# Patient Record
Sex: Male | Born: 1950 | Race: Black or African American | Hispanic: No | Marital: Single | Smoking: Current every day smoker
Health system: Southern US, Community
[De-identification: ages and names within clinical notes are randomized; demographics above are authoritative.]

## PROBLEM LIST (undated history)

## (undated) DIAGNOSIS — I10 Essential (primary) hypertension: Secondary | ICD-10-CM

## (undated) DIAGNOSIS — S0292XB Unspecified fracture of facial bones, initial encounter for open fracture: Secondary | ICD-10-CM

## (undated) DIAGNOSIS — E785 Hyperlipidemia, unspecified: Secondary | ICD-10-CM

## (undated) DIAGNOSIS — E119 Type 2 diabetes mellitus without complications: Secondary | ICD-10-CM

## (undated) DIAGNOSIS — F17200 Nicotine dependence, unspecified, uncomplicated: Secondary | ICD-10-CM

## (undated) HISTORY — DX: Nicotine dependence, unspecified, uncomplicated: F17.200

## (undated) HISTORY — DX: Essential (primary) hypertension: I10

## (undated) HISTORY — DX: Type 2 diabetes mellitus without complications: E11.9

## (undated) HISTORY — DX: Hyperlipidemia, unspecified: E78.5

---
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
## 2004-04-04 ENCOUNTER — Emergency Department (HOSPITAL_COMMUNITY): Admission: EM | Admit: 2004-04-04 | Discharge: 2004-04-04 | Payer: Self-pay | Admitting: Emergency Medicine

## 2007-09-23 ENCOUNTER — Emergency Department (HOSPITAL_COMMUNITY): Admission: EM | Admit: 2007-09-23 | Discharge: 2007-09-23 | Payer: Self-pay | Admitting: Emergency Medicine

## 2010-10-07 ENCOUNTER — Encounter: Payer: Self-pay | Admitting: Internal Medicine

## 2011-07-05 ENCOUNTER — Inpatient Hospital Stay (INDEPENDENT_AMBULATORY_CARE_PROVIDER_SITE_OTHER)
Admission: RE | Admit: 2011-07-05 | Discharge: 2011-07-05 | Disposition: A | Discharge status: Home / Self Care | Payer: BC Managed Care – PPO | Source: Ambulatory Visit | Attending: Emergency Medicine | Admitting: Emergency Medicine

## 2011-07-05 DIAGNOSIS — I10 Essential (primary) hypertension: Secondary | ICD-10-CM

## 2011-07-05 DIAGNOSIS — H00019 Hordeolum externum unspecified eye, unspecified eyelid: Secondary | ICD-10-CM

## 2011-07-05 DIAGNOSIS — Z9119 Patient's noncompliance with other medical treatment and regimen: Secondary | ICD-10-CM

## 2011-07-05 LAB — POCT URINALYSIS DIP (DEVICE)
Glucose, UA: NEGATIVE mg/dL
Hgb urine dipstick: NEGATIVE
Leukocytes, UA: NEGATIVE
Nitrite: NEGATIVE
Specific Gravity, Urine: 1.025 (ref 1.005–1.030)
Urobilinogen, UA: 0.2 mg/dL (ref 0.0–1.0)

## 2011-07-05 LAB — POCT I-STAT, CHEM 8
BUN: 12 mg/dL (ref 6–23)
Creatinine, Ser: 1 mg/dL (ref 0.50–1.35)
Glucose, Bld: 95 mg/dL (ref 70–99)
Potassium: 3.9 mEq/L (ref 3.5–5.1)

## 2014-05-16 DIAGNOSIS — I10 Essential (primary) hypertension: Secondary | ICD-10-CM | POA: Diagnosis not present

## 2014-05-16 DIAGNOSIS — E785 Hyperlipidemia, unspecified: Secondary | ICD-10-CM | POA: Diagnosis not present

## 2014-05-16 DIAGNOSIS — F17200 Nicotine dependence, unspecified, uncomplicated: Secondary | ICD-10-CM | POA: Diagnosis not present

## 2015-10-20 ENCOUNTER — Encounter: Payer: Self-pay | Admitting: Internal Medicine

## 2015-10-20 ENCOUNTER — Ambulatory Visit (INDEPENDENT_AMBULATORY_CARE_PROVIDER_SITE_OTHER): Payer: Medicare Other | Admitting: Internal Medicine

## 2015-10-20 VITALS — BP 160/100 | HR 80 | Temp 98.0°F | Ht 68.0 in | Wt 198.5 lb

## 2015-10-20 DIAGNOSIS — I10 Essential (primary) hypertension: Secondary | ICD-10-CM | POA: Diagnosis not present

## 2015-10-20 DIAGNOSIS — E785 Hyperlipidemia, unspecified: Secondary | ICD-10-CM | POA: Diagnosis not present

## 2015-10-20 DIAGNOSIS — Z23 Encounter for immunization: Secondary | ICD-10-CM | POA: Diagnosis not present

## 2015-10-20 DIAGNOSIS — Z0000 Encounter for general adult medical examination without abnormal findings: Secondary | ICD-10-CM | POA: Insufficient documentation

## 2015-10-20 LAB — GLUCOSE, CAPILLARY: Glucose-Capillary: 128 mg/dL — ABNORMAL HIGH (ref 65–99)

## 2015-10-20 MED ORDER — LISINOPRIL 20 MG PO TABS: 20.0000 mg | ORAL_TABLET | Freq: Every day | ORAL | Status: DC

## 2015-10-20 MED ORDER — HYDROCHLOROTHIAZIDE 12.5 MG PO CAPS: 12.5000 mg | ORAL_CAPSULE | Freq: Every day | ORAL | Status: DC

## 2015-10-20 NOTE — Progress Notes (Deleted)
Patient ID: Samuel Weiss, male   DOB: 1951-02-06, 65 y.o.   MRN: [id number]   Subjective:   Patient ID: Samuel Weiss male   DOB: July 19, 1951 65 y.o.   MRN: [id number]  HPI: [name] is a 65 y.o.     No past medical history on file. No current outpatient prescriptions on file.   No current facility-administered medications for this visit.   No family history on file. Social History   Social History  . Marital Status: Single    Spouse Name: N/A  . Number of Children: N/A  . Years of Education: N/A   Social History Main Topics  . Smoking status: Not on file  . Smokeless tobacco: Not on file  . Alcohol Use: Not on file  . Drug Use: Not on file  . Sexual Activity: Not on file   Other Topics Concern  . Not on file   Social History Narrative  . No narrative on file   Review of Systems: {Review Of Systems:30496} Objective:  Physical Exam: There were no vitals filed for this visit. {Exam, Complete:17964} Assessment & Plan:

## 2015-10-20 NOTE — Assessment & Plan Note (Signed)
-  Given Flu shot today.  -Discussed need for colonoscopy, will need to arrange at future visit given issue with insurance card.  -Check basic labs; CBC, CMP, lipid panel.

## 2015-10-20 NOTE — Patient Instructions (Signed)
1. Please make a follow up appointment for 2 weeks.   2. Please take all medications as previously prescribed with the following changes:  Start taking Lisinopril 20 mg daily + HCTZ 12.5 mg daily for blood pressure control.   At your next visit, we will likely start a cholesterol medication as well.   3. If you have worsening of your symptoms or new symptoms arise, please call the clinic [phone] or go to the ER immediately if symptoms are severe.  Hypertension Hypertension, commonly called high blood pressure, is when the force of blood pumping through your arteries is too strong. Your arteries are the blood vessels that carry blood from your heart throughout your body. A blood pressure reading consists of a higher number over a lower number, such as 110/72. The higher number (systolic) is the pressure inside your arteries when your heart pumps. The lower number (diastolic) is the pressure inside your arteries when your heart relaxes. Ideally you want your blood pressure below 120/80. Hypertension forces your heart to work harder to pump blood. Your arteries may become narrow or stiff. Having untreated or uncontrolled hypertension can cause heart attack, stroke, kidney disease, and other problems. RISK FACTORS Some risk factors for high blood pressure are controllable. Others are not.  Risk factors you cannot control include:   Race. You may be at higher risk if you are African American.  Age. Risk increases with age.  Gender. Men are at higher risk than women before age 54 years. After age 33, women are at higher risk than men. Risk factors you can control include:  Not getting enough exercise or physical activity.  Being overweight.  Getting too much fat, sugar, calories, or salt in your diet.  Drinking too much alcohol. SIGNS AND SYMPTOMS Hypertension does not usually cause signs or symptoms. Extremely high blood pressure (hypertensive crisis) may cause headache, anxiety,  shortness of breath, and nosebleed. DIAGNOSIS To check if you have hypertension, your health care provider will measure your blood pressure while you are seated, with your arm held at the level of your heart. It should be measured at least twice using the same arm. Certain conditions can cause a difference in blood pressure between your right and left arms. A blood pressure reading that is higher than normal on one occasion does not mean that you need treatment. If it is not clear whether you have high blood pressure, you may be asked to return on a different day to have your blood pressure checked again. Or, you may be asked to monitor your blood pressure at home for 1 or more weeks. TREATMENT Treating high blood pressure includes making lifestyle changes and possibly taking medicine. Living a healthy lifestyle can help lower high blood pressure. You may need to change some of your habits. Lifestyle changes may include:  Following the DASH diet. This diet is high in fruits, vegetables, and whole grains. It is low in salt, red meat, and added sugars.  Keep your sodium intake below 2,300 mg per day.  Getting at least 30-45 minutes of aerobic exercise at least 4 times per week.  Losing weight if necessary.  Not smoking.  Limiting alcoholic beverages.  Learning ways to reduce stress. Your health care provider may prescribe medicine if lifestyle changes are not enough to get your blood pressure under control, and if one of the following is true:  You are 48-69 years of age and your systolic blood pressure is above 140.  You are 60  years of age or older, and your systolic blood pressure is above 150.  Your diastolic blood pressure is above 90.  You have diabetes, and your systolic blood pressure is over [id number] or your diastolic blood pressure is over 90.  You have kidney disease and your blood pressure is above 140/90.  You have heart disease and your blood pressure is above 140/90. Your  personal target blood pressure may vary depending on your medical conditions, your age, and other factors. HOME CARE INSTRUCTIONS  Have your blood pressure rechecked as directed by your health care provider.   Take medicines only as directed by your health care provider. Follow the directions carefully. Blood pressure medicines must be taken as prescribed. The medicine does not work as well when you skip doses. Skipping doses also puts you at risk for problems.  Do not smoke.   Monitor your blood pressure at home as directed by your health care provider. SEEK MEDICAL CARE IF:   You think you are having a reaction to medicines taken.  You have recurrent headaches or feel dizzy.  You have swelling in your ankles.  You have trouble with your vision. SEEK IMMEDIATE MEDICAL CARE IF:  You develop a severe headache or confusion.  You have unusual weakness, numbness, or feel faint.  You have severe chest or abdominal pain.  You vomit repeatedly.  You have trouble breathing. MAKE SURE YOU:   Understand these instructions.  Will watch your condition.  Will get help right away if you are not doing well or get worse.   This information is not intended to replace advice given to you by your health care provider. Make sure you discuss any questions you have with your health care provider.   Document Released: 09/02/2005 Document Revised: 01/17/2015 Document Reviewed: 06/25/2013 Elsevier Interactive Patient Education [employer].

## 2015-10-20 NOTE — Progress Notes (Signed)
   Subjective:   Patient ID: Samuel Weiss male   DOB: [birth year] 65 y.o.   MRN: [id number]  HPI: [name] is a 65 y.o. male w/ PMHx of HTN and ?HLD, not on any medications, presents to the clinic today for a new patient visit. Patient used to be seen at [facility], but is changing providers because his family comes to the [facility] clinic. Patient has no significant complaints today but is interested in re-establishing with a physician. He states he has diagnosed HTN but has not taken any medications since September. He does not remember what medications he took but knows he took three different pills, two for his BP and one he thinks for his cholesterol. He denies any symptoms of SOB, chest pain, dizziness, lightheadedness, nausea, diarrhea, abdominal pain, recent changes in his weight, palpitations, or difficulty sleeping. He admit to drinking 6 beers/week, smokes 1 pack of cigarettes/week, smokes marijuana occasionally. 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   Past Medical History  Diagnosis Date  . Hypertension   . Hyperlipidemia    Family History  Problem Relation Age of Onset  . Hypertension Mother   . Hypertension Father    . Current Outpatient Prescriptions  Medication Sig Dispense Refill  . hydrochlorothiazide (MICROZIDE) 12.5 MG capsule Take 1 capsule (12.5 mg total) by mouth daily. 30 capsule 1  . lisinopril (PRINIVIL,ZESTRIL) 20 MG tablet Take 1 tablet (20 mg total) by mouth daily. 30 tablet 1   No current facility-administered medications for this visit.    Review of Systems: General: Denies fever, chills, diaphoresis, appetite change and fatigue.  Respiratory: Denies SOB, DOE, cough, and wheezing.   Cardiovascular: Denies chest pain and palpitations.  Gastrointestinal: Denies nausea, vomiting, abdominal pain, and diarrhea.  Genitourinary: Denies dysuria, increased frequency, and flank pain. Endocrine: Denies hot or  cold intolerance, polyuria, and polydipsia. Musculoskeletal: Denies myalgias, back pain, joint swelling, arthralgias and gait problem.  Skin: Denies pallor, rash and wounds.  Neurological: Denies dizziness, seizures, syncope, weakness, lightheadedness, numbness and headaches.  Psychiatric/Behavioral: Denies mood changes, and sleep disturbances.  Objective:   Physical Exam: Filed Vitals:   10/20/15 1452 10/20/15 1533  BP: 197/96 160/100  Pulse: 89 80  Temp: 98 F (36.7 C)   Height: 5\' 8"  (1.727 m)   Weight: 198 lb 8 oz (90.039 kg)   SpO2: 98%     General: AA male, alert, cooperative, NAD. HEENT: PERRL, EOMI. Moist mucus membranes. Arcus senilis.  Neck: Full range of motion without pain, supple, no lymphadenopathy or carotid bruits Lungs: Clear to ascultation bilaterally, normal work of respiration, no wheezes, rales, rhonchi Heart: RRR, no murmurs, gallops, or rubs Abdomen: Soft, non-tender, non-distended, BS + Extremities: No cyanosis, clubbing, or edema Neurologic: Alert & oriented x3, cranial nerves II-XII intact, strength grossly intact, sensation intact to light touch   Assessment & Plan:   Please see problem based assessment and plan.

## 2015-10-20 NOTE — Assessment & Plan Note (Signed)
Check lipids, will likely need to be on a statin given that patient thinks he was on one previously.

## 2015-10-20 NOTE — Assessment & Plan Note (Signed)
Patient with known history of HTN, previously on medications but has not taken anything since 05/2015. Says he took two different pills for his BP. Today, SBP in the 190's initially, DBP in the high 90's. Does not have symptoms of severe HTN. No headache, nausea, or vision changes.  -Given level of BP elevation, feel it is necessary to start 2 agents. Will start with Lisinopril 20 mg daily + HCTZ 12.5 mg daily.  -Check basic labs, renal function -RTC in 2 weeks for BP recheck and medication adjustment -Check lipid panel -Obtain records from [facility]

## 2015-10-21 LAB — CMP14 + ANION GAP
A/G RATIO: 1.6 (ref 1.1–2.5)
ALK PHOS: 64 IU/L (ref 39–117)
ALT: 31 IU/L (ref 0–44)
ANION GAP: 18 mmol/L (ref 10.0–18.0)
AST: 25 IU/L (ref 0–40)
Albumin: 4.4 g/dL (ref 3.6–4.8)
BUN/Creatinine Ratio: 10 (ref 10–22)
BUN: 11 mg/dL (ref 8–27)
Bilirubin Total: <0.2 mg/dL (ref 0.0–1.2)
CALCIUM: 9.6 mg/dL (ref 8.6–10.2)
CO2: 24 mmol/L (ref 18–29)
Chloride: 101 mmol/L (ref 96–106)
Creatinine, Ser: 1.15 mg/dL (ref 0.76–1.27)
GFR calc Af Amer: 76 mL/min/{1.73_m2} (ref >59)
GFR, EST NON AFRICAN AMERICAN: 66 mL/min/{1.73_m2} (ref >59)
GLOBULIN, TOTAL: 2.7 g/dL (ref 1.5–4.5)
GLUCOSE: 96 mg/dL (ref 65–99)
POTASSIUM: 4.6 mmol/L (ref 3.5–5.2)
SODIUM: 143 mmol/L (ref 134–144)
Total Protein: 7.1 g/dL (ref 6.0–8.5)

## 2015-10-21 LAB — LIPID PANEL
CHOLESTEROL TOTAL: 222 mg/dL — AB (ref 100–199)
Chol/HDL Ratio: 5.3 ratio units — ABNORMAL HIGH (ref 0.0–5.0)
HDL: 42 mg/dL (ref >39)
LDL Calculated: 145 mg/dL — ABNORMAL HIGH (ref 0–99)
Triglycerides: 174 mg/dL — ABNORMAL HIGH (ref 0–149)
VLDL Cholesterol Cal: 35 mg/dL (ref 5–40)

## 2015-10-21 LAB — CBC WITH DIFFERENTIAL/PLATELET
BASOS ABS: 0 10*3/uL (ref 0.0–0.2)
Basos: 1 %
EOS (ABSOLUTE): 0.6 10*3/uL — AB (ref 0.0–0.4)
Eos: 9 %
Hematocrit: 43.3 % (ref 37.5–51.0)
Hemoglobin: 13.6 g/dL (ref 12.6–17.7)
IMMATURE GRANS (ABS): 0 10*3/uL (ref 0.0–0.1)
IMMATURE GRANULOCYTES: 0 %
LYMPHS: 46 %
Lymphocytes Absolute: 2.8 10*3/uL (ref 0.7–3.1)
MCH: 24.3 pg — ABNORMAL LOW (ref 26.6–33.0)
MCHC: 31.4 g/dL — ABNORMAL LOW (ref 31.5–35.7)
MCV: 78 fL — ABNORMAL LOW (ref 79–97)
MONOS ABS: 0.7 10*3/uL (ref 0.1–0.9)
Monocytes: 11 %
NEUTROS PCT: 33 %
Neutrophils Absolute: 2 10*3/uL (ref 1.4–7.0)
PLATELETS: 267 10*3/uL (ref 150–379)
RBC: 5.59 x10E6/uL (ref 4.14–5.80)
RDW: 16.9 % — AB (ref 12.3–15.4)
WBC: 6 10*3/uL (ref 3.4–10.8)

## 2015-10-25 NOTE — Progress Notes (Signed)
Internal Medicine Clinic Attending  Case discussed with Dr. Jones soon after the resident saw the patient.  We reviewed the resident's history and exam and pertinent patient test results.  I agree with the assessment, diagnosis, and plan of care documented in the resident's note. 

## 2015-10-25 NOTE — Addendum Note (Signed)
Addended by: Lalla Brothers T on: 10/25/2015 10:24 AM   Modules accepted: Level of Service, SmartSet

## 2015-11-03 ENCOUNTER — Encounter: Payer: Self-pay | Admitting: Internal Medicine

## 2015-11-03 ENCOUNTER — Ambulatory Visit (INDEPENDENT_AMBULATORY_CARE_PROVIDER_SITE_OTHER): Payer: Medicare Other | Admitting: Internal Medicine

## 2015-11-03 VITALS — BP 178/92 | HR 84 | Temp 98.2°F | Ht 68.0 in | Wt 197.0 lb

## 2015-11-03 DIAGNOSIS — E785 Hyperlipidemia, unspecified: Secondary | ICD-10-CM

## 2015-11-03 DIAGNOSIS — I10 Essential (primary) hypertension: Secondary | ICD-10-CM | POA: Diagnosis not present

## 2015-11-03 DIAGNOSIS — Z0000 Encounter for general adult medical examination without abnormal findings: Secondary | ICD-10-CM

## 2015-11-03 MED ORDER — ATORVASTATIN CALCIUM 40 MG PO TABS: 40.0000 mg | ORAL_TABLET | Freq: Every day | ORAL | Status: DC

## 2015-11-03 MED ORDER — HYDROCHLOROTHIAZIDE 12.5 MG PO CAPS: 25.0000 mg | ORAL_CAPSULE | Freq: Every day | ORAL | Status: DC

## 2015-11-03 NOTE — Assessment & Plan Note (Signed)
BP elevated today, likely due to the fact that he didn't take his meds today. Tolerating doses well.  -Increase dose of HCTZ to 25 mg daily.  -Continue Lisinopril 20 mg daily.  -RTC in 2 weeks for BP check.

## 2015-11-03 NOTE — Patient Instructions (Signed)
1. Please return in 2 weeks for follow up regarding your blood pressure.   2. Please take all medications as previously prescribed with the following changes:  Increase your dose of HCTZ to 25 mg daily (2 tablets daily)  Start taking Lipitor 40 mg at night before bedtime.   Change the name on your insurance card so we can refer you for a screening colonoscopy.   3. If you have worsening of your symptoms or new symptoms arise, please call the clinic [phone]), or go to the ER immediately if symptoms are severe.

## 2015-11-03 NOTE — Assessment & Plan Note (Signed)
Needs colonoscopy, although needs to change name on insurance card prior to referral to GI.  -Will plan on GI referral at next clinic visit.

## 2015-11-03 NOTE — Progress Notes (Signed)
   Subjective:   Patient ID: Samuel Weiss male   DOB: 03-07-1951 65 y.o.   MRN: [id number]  HPI: [name] is a 65 y.o. male w/ PMHx of HTN and HLD, not on any medications, presents to the clinic today for follow up regarding his HTN.  Patient is doing well today, no complaints. Did not take his BP medications today therefore his BP is high during this visit. Otherwise he has been taking his meds every day. No adverse effects.   Current Outpatient Prescriptions  Medication Sig Dispense Refill  . atorvastatin (LIPITOR) 40 MG tablet Take 1 tablet (40 mg total) by mouth daily. 30 tablet 11  . hydrochlorothiazide (MICROZIDE) 12.5 MG capsule Take 2 capsules (25 mg total) by mouth daily. 60 capsule 5  . lisinopril (PRINIVIL,ZESTRIL) 20 MG tablet Take 1 tablet (20 mg total) by mouth daily. 30 tablet 1   No current facility-administered medications for this visit.    Review of Systems  General: Denies fever, diaphoresis, appetite change, and fatigue.  Respiratory: Denies SOB, cough, and wheezing.   Cardiovascular: Denies chest pain and palpitations.  Gastrointestinal: Denies nausea, vomiting, abdominal pain, and diarrhea Musculoskeletal: Denies myalgias, arthralgias, back pain, and gait problem.  Neurological: Denies dizziness, syncope, weakness, lightheadedness, and headaches.  Psychiatric/Behavioral: Denies mood changes, sleep disturbance, and agitation.   Objective:   Physical Exam: Filed Vitals:   11/03/15 1327  BP: 178/92  Pulse: 84  Temp: 98.2 F (36.8 C)  TempSrc: Oral  Height: 5\' 8"  (1.727 m)  Weight: 197 lb (89.359 kg)  SpO2: 97%    General: AA male, alert, cooperative, NAD. HEENT: PERRL, EOMI. Moist mucus membranes. Arcus senilis.  Neck: Full range of motion without pain, supple, no lymphadenopathy or carotid bruits Lungs: Clear to ascultation bilaterally, normal work of respiration, no wheezes, rales, rhonchi Heart: RRR, no murmurs, gallops, or  rubs Abdomen: Soft, non-tender, non-distended, BS + Extremities: No cyanosis, clubbing, or edema Neurologic: Alert & oriented x3, cranial nerves II-XII intact, strength grossly intact, sensation intact to light touch   Assessment & Plan:   Please see problem based assessment and plan.

## 2015-11-03 NOTE — Assessment & Plan Note (Signed)
Lipid panel as follows:  Lipid Panel     Component Value Date/Time   CHOL 222* 10/20/2015 1540   TRIG 174* 10/20/2015 1540   HDL 42 10/20/2015 1540   CHOLHDL 5.3* 10/20/2015 1540   LDLCALC 145* 10/20/2015 1540  ASCVD 10 year risk of 43%.  -Start Lipitor 40 mg qhs.

## 2015-11-06 NOTE — Progress Notes (Signed)
Internal Medicine Clinic Attending  Case discussed with Dr. Jones at the time of the visit.  We reviewed the resident's history and exam and pertinent patient test results.  I agree with the assessment, diagnosis, and plan of care documented in the resident's note.  

## 2015-11-17 ENCOUNTER — Ambulatory Visit (INDEPENDENT_AMBULATORY_CARE_PROVIDER_SITE_OTHER): Payer: Medicare Other | Admitting: Internal Medicine

## 2015-11-17 ENCOUNTER — Encounter: Payer: Self-pay | Admitting: Internal Medicine

## 2015-11-17 VITALS — BP 160/79 | HR 71 | Temp 98.0°F | Ht 68.0 in | Wt 194.9 lb

## 2015-11-17 DIAGNOSIS — F17210 Nicotine dependence, cigarettes, uncomplicated: Secondary | ICD-10-CM

## 2015-11-17 DIAGNOSIS — I10 Essential (primary) hypertension: Secondary | ICD-10-CM

## 2015-11-17 DIAGNOSIS — F17200 Nicotine dependence, unspecified, uncomplicated: Secondary | ICD-10-CM | POA: Insufficient documentation

## 2015-11-17 MED ORDER — HYDROCHLOROTHIAZIDE 12.5 MG PO CAPS: 25.0000 mg | ORAL_CAPSULE | Freq: Every day | ORAL | Status: DC

## 2015-11-17 MED ORDER — LISINOPRIL 20 MG PO TABS: 20.0000 mg | ORAL_TABLET | Freq: Every day | ORAL | Status: DC

## 2015-11-17 NOTE — Assessment & Plan Note (Signed)
Assessment: His blood pressure today was 160/80, he had not been taking his medication for the last 4 days because he ran out. I refilled them today.  Plan: I refilled HCTZ 25 mg daily and lisinopril 20 mg daily. We'll see him back in 2 months; we have room to go up on the lisinopril should he require it.

## 2015-11-17 NOTE — Progress Notes (Signed)
Patient ID: Samuel Weiss, male   DOB: 03-Jul-1951, 65 y.o.   MRN: [id number] [name] Subjective:   Patient ID: Samuel Weiss male   DOB: 1950/12/19 65 y.o.   MRN: [id number]  HPI: Samuel Weiss is a 65 y.o. male with a history of hypertension, tobacco abuse, and hyperlipidemia presenting to clinic for follow-up of hypertension and tobacco abuse.  Hypertension: He has not been checking his pressures at home. He ran out of his medications 4 days ago, and is requesting a refill. His blood pressure today is 170/80.  Tobacco abuse: He has cut back to 3 cigarettes daily. He is interested in quitting on his own, and does not want a patch or gum.  I reviewed his medications with him and he is currently smoking 3 cigarettes daily.  Review of Systems  Constitutional: Negative for fever, chills and weight loss.  Respiratory: Negative for shortness of breath and wheezing.   Cardiovascular: Negative for chest pain and orthopnea.  Skin: Negative for rash.   Objective:  Physical Exam: Filed Vitals:   11/17/15 1319  BP: 160/79  Pulse: 71  Temp: 98 F (36.7 C)  TempSrc: Oral  Height: 5\' 8"  (1.727 m)  Weight: 194 lb 14.4 oz (88.406 kg)  SpO2: 98%   General: very friendly man resting in chair comfortably, appropriately conversational HEENT: arcus senilis present Cardiac: regular rate and rhythm, no rubs, murmurs or gallops Pulm: breathing well, clear to auscultation bilaterally Lymph: no cervical or supraclavicular lymphadenopathy  Assessment & Plan:  Case discussed with Dr. Beryle Beams  HTN (hypertension) Assessment: His blood pressure today was 160/80, he had not been taking his medication for the last 4 days because he ran out. I refilled them today.  Plan: I refilled HCTZ 25 mg daily and lisinopril 20 mg daily. We'll see him back in 2 months; we have room to go up on the lisinopril should he require it.   Tobacco use disorder Assessment: He has  cut back from 10-3 cigarettes daily over the last month. I offered NRT or medications, he declined and said he wants to continue trying to quit himself.  Plan: We'll continue addressing his smoking at subsequent visits, and offering cessation therapy if he ever wants it.    Medications Ordered Meds ordered this encounter  Medications  . hydrochlorothiazide (MICROZIDE) 12.5 MG capsule    Sig: Take 2 capsules (25 mg total) by mouth daily.    Dispense:  60 capsule    Refill:  5   Other Orders No orders of the defined types were placed in this encounter.   Follow Up: Return in about 2 months (around 01/17/2016).

## 2015-11-17 NOTE — Assessment & Plan Note (Signed)
Assessment: He has cut back from 10-3 cigarettes daily over the last month. I offered NRT or medications, he declined and said he wants to continue trying to quit himself.  Plan: We'll continue addressing his smoking at subsequent visits, and offering cessation therapy if he ever wants it.

## 2015-11-17 NOTE — Patient Instructions (Signed)
Mr. Wicht,  It was great meeting you today.  I've re-filled your blood pressure medications today. Come back and see [place] in 1-2 months so we can check it again.  Also, quitting smoking is hands down the best thing you can do for your health. If he ever want any help with patches, gum, or medications, don't hesitate to give [place] a call and schedule an appointment.  Take care, and we'll see you in one to 2 months, Dr. Melburn Hake

## 2015-11-17 NOTE — Progress Notes (Signed)
Medicine attending: Medical history, presenting problems, physical findings, and medications, reviewed with resident physician Dr Kyle Flores on the day of the patient visit and I concur with his evaluation and management plan. 

## 2016-02-21 ENCOUNTER — Encounter: Payer: Medicare Other | Admitting: Internal Medicine

## 2016-08-26 ENCOUNTER — Encounter: Payer: Medicare Other | Admitting: Internal Medicine

## 2016-08-26 ENCOUNTER — Encounter: Payer: Self-pay | Admitting: Internal Medicine

## 2016-08-28 ENCOUNTER — Encounter: Payer: Medicare Other | Admitting: Internal Medicine

## 2016-09-16 ENCOUNTER — Telehealth: Payer: Self-pay | Admitting: Internal Medicine

## 2016-09-16 MED ORDER — POLYETHYLENE GLYCOL 3350 17 G PO PACK: 17.0000 g | PACK | Freq: Every day | ORAL | 0 refills | Status: DC

## 2016-09-16 MED ORDER — SENNOSIDES-DOCUSATE SODIUM 8.6-50 MG PO TABS
1.0000 | ORAL_TABLET | Freq: Two times a day (BID) | ORAL | 1 refills | Status: DC
Start: 2016-09-16 — End: 2017-11-19

## 2016-09-16 NOTE — Telephone Encounter (Signed)
   Reason for call:   I received a call from [name] at 9 AM AM indicating below.   Pertinent Data:   Says he has been having constipation for last few days. Does not feel like he had a bowel movement and he passed very little amount of stool which was liquid. On Friday, he drank 2 glasses of apple juice and had some vomiting. Currently, he denies any nausea, vomiting, or abdominal pain. He is eating regular diet. He is wondering if laxatives will help his constipation     Assessment / Plan / Recommendations:   Advised to try senokot, and miralax for his constipation which I sent to [place] of his choice.  Advised that for persistent constipation, he can call tomorrow morning to make an appointment in the clinic to be seen in [facility] to address his symptoms  He indicated understanding of everything I said and repeated back instructions which were what I said.  As always, pt is advised that if symptoms worsen or new symptoms arise, they should go to an urgent care facility or to to ER for further evaluation.   Burgess Estelle, MD   09/16/2016, 9:11 AM

## 2016-09-18 ENCOUNTER — Encounter (INDEPENDENT_AMBULATORY_CARE_PROVIDER_SITE_OTHER): Payer: Self-pay

## 2016-09-18 ENCOUNTER — Ambulatory Visit (INDEPENDENT_AMBULATORY_CARE_PROVIDER_SITE_OTHER): Payer: Medicare Other | Admitting: Internal Medicine

## 2016-09-18 ENCOUNTER — Encounter: Payer: Self-pay | Admitting: Internal Medicine

## 2016-09-18 VITALS — BP 170/85 | HR 73 | Temp 98.0°F | Wt 190.0 lb

## 2016-09-18 DIAGNOSIS — Z0000 Encounter for general adult medical examination without abnormal findings: Secondary | ICD-10-CM | POA: Diagnosis not present

## 2016-09-18 DIAGNOSIS — F17200 Nicotine dependence, unspecified, uncomplicated: Secondary | ICD-10-CM

## 2016-09-18 DIAGNOSIS — F17210 Nicotine dependence, cigarettes, uncomplicated: Secondary | ICD-10-CM

## 2016-09-18 DIAGNOSIS — E785 Hyperlipidemia, unspecified: Secondary | ICD-10-CM

## 2016-09-18 DIAGNOSIS — I10 Essential (primary) hypertension: Secondary | ICD-10-CM | POA: Diagnosis not present

## 2016-09-18 DIAGNOSIS — K5900 Constipation, unspecified: Secondary | ICD-10-CM | POA: Diagnosis not present

## 2016-09-18 DIAGNOSIS — Z9114 Patient's other noncompliance with medication regimen: Secondary | ICD-10-CM

## 2016-09-18 DIAGNOSIS — Z79899 Other long term (current) drug therapy: Secondary | ICD-10-CM

## 2016-09-18 MED ORDER — LISINOPRIL 40 MG PO TABS: 40.0000 mg | ORAL_TABLET | Freq: Every day | ORAL | 3 refills | Status: DC

## 2016-09-18 MED ORDER — ATORVASTATIN CALCIUM 40 MG PO TABS: 40.0000 mg | ORAL_TABLET | Freq: Every day | ORAL | 3 refills | Status: DC

## 2016-09-18 MED ORDER — HYDROCHLOROTHIAZIDE 12.5 MG PO CAPS: 25.0000 mg | ORAL_CAPSULE | Freq: Every day | ORAL | 3 refills | Status: DC

## 2016-09-18 NOTE — Assessment & Plan Note (Signed)
Patient smokes 3-4 cigarettes daily. He has thought about quitting completely and feels that he will do this on his own.  I offered NRT with patches and/or gum/lozenges which he declines. He wants to try quitting on his own for now. Patient provided 1-800-QUIT-NOW line for further assistance as needed. He will call us as well for smoking cessation assistance when he is ready.

## 2016-09-18 NOTE — Assessment & Plan Note (Signed)
Reports inconsistently taking Atorvastatin 40 mg daily.  Lipid Panel     Component Value Date/Time   CHOL 222 (H) 10/20/2015 1540   TRIG 174 (H) 10/20/2015 1540   HDL 42 10/20/2015 1540   CHOLHDL 5.3 (H) 10/20/2015 1540   LDLCALC 145 (H) 10/20/2015 1540   Patient advised on ASCVD risk and use of Atorvastatin for prevention. -Continue Atorvastatin 40 mg qhs

## 2016-09-18 NOTE — Assessment & Plan Note (Signed)
Patient reports receiving the flu shot in October 2017 at work. He has not had a screening colonoscopy before. -Referral for screening colonoscopy -Check Hep C antibody - patient advised on further management if this returns positive (HCV RNA, ID referral if needed)

## 2016-09-18 NOTE — Assessment & Plan Note (Signed)
Patient reports constipation 1 week ago. He called the on call resident who sent in prescriptions for Miralax and Senokot-S. He has picked up Miralax and reports a loose stool this morning. He denies any hematochezia or melena. No significant weight change. He has a good appetite. No prior colonoscopy.  Bowel movements slowly returning although loose this morning. Patient advised to continue Miralax as needed and try Senokot-S and Metamucil as needed. Will refer for screening colonoscopy as well as he is never had this done.

## 2016-09-18 NOTE — Patient Instructions (Addendum)
It was a pleasure to meet you Samuel Weiss.  Your blood pressure is high today. I have refilled your Hydrochlorothiazide 25 mg daily and increased your Lisinopril to 40 mg daily.  I have refilled your cholesterol medication, Atorvastatin (Lipitor).  We are checking blood work today for your potassium and kidney numbers. We are also checking for Hepatitis C.  For your constipation, continue the Miralax as needed. You can try Senokot-S to help move things along as well. You can try Metamucil to help soften the stools.  Please try your best to completely quit smoking. Call 1-800-QUIT-NOW for help if needed.  I have sent a referral for a colonoscopy. You should be contacted for an appointment.  Please follow up with [place] in 1 month to recheck your blood pressure.   DASH Eating Plan DASH stands for "Dietary Approaches to Stop Hypertension." The DASH eating plan is a healthy eating plan that has been shown to reduce high blood pressure (hypertension). Additional health benefits may include reducing the risk of type 2 diabetes mellitus, heart disease, and stroke. The DASH eating plan may also help with weight loss. What do I need to know about the DASH eating plan? For the DASH eating plan, you will follow these general guidelines:  Choose foods with less than 150 milligrams of sodium per serving (as listed on the food label).  Use salt-free seasonings or herbs instead of table salt or sea salt.  Check with your health care provider or pharmacist before using salt substitutes.  Eat lower-sodium products. These are often labeled as "low-sodium" or "no salt added."  Eat fresh foods. Avoid eating a lot of canned foods.  Eat more vegetables, fruits, and low-fat dairy products.  Choose whole grains. Look for the word "whole" as the first word in the ingredient list.  Choose fish and skinless chicken or [place] more often than red meat. Limit fish, poultry, and meat to 6 oz (170 g) each  day.  Limit sweets, desserts, sugars, and sugary drinks.  Choose heart-healthy fats.  Eat more home-cooked food and less restaurant, buffet, and fast food.  Limit fried foods.  Do not fry foods. Cook foods using methods such as baking, boiling, grilling, and broiling instead.  When eating at a restaurant, ask that your food be prepared with less salt, or no salt if possible. What foods can I eat? Seek help from a dietitian for individual calorie needs. Grains  Whole grain or whole wheat bread. Brown rice. Whole grain or whole wheat pasta. Quinoa, bulgur, and whole grain cereals. Low-sodium cereals. Corn or whole wheat flour tortillas. Whole grain cornbread. Whole grain crackers. Low-sodium crackers. Vegetables  Fresh or frozen vegetables (raw, steamed, roasted, or grilled). Low-sodium or reduced-sodium tomato and vegetable juices. Low-sodium or reduced-sodium tomato sauce and paste. Low-sodium or reduced-sodium canned vegetables. Fruits  All fresh, canned (in natural juice), or frozen fruits. Meat and Other Protein Products  Ground beef (85% or leaner), grass-fed beef, or beef trimmed of fat. Skinless chicken or [place]. Ground chicken or [place]. Pork trimmed of fat. All fish and seafood. Eggs. Dried beans, peas, or lentils. Unsalted nuts and seeds. Unsalted canned beans. Dairy  Low-fat dairy products, such as skim or 1% milk, 2% or reduced-fat cheeses, low-fat ricotta or cottage cheese, or plain low-fat yogurt. Low-sodium or reduced-sodium cheeses. Fats and Oils  Tub margarines without trans fats. Light or reduced-fat mayonnaise and salad dressings (reduced sodium). Avocado. Safflower, olive, or canola oils. Natural peanut or almond butter. Other  Unsalted popcorn and pretzels. The items listed above may not be a complete list of recommended foods or beverages. Contact your dietitian for more options.  What foods are not recommended? Grains  White bread. White pasta. White rice.  Refined cornbread. Bagels and croissants. Crackers that contain trans fat. Vegetables  Creamed or fried vegetables. Vegetables in a cheese sauce. Regular canned vegetables. Regular canned tomato sauce and paste. Regular tomato and vegetable juices. Fruits  Canned fruit in light or heavy syrup. Fruit juice. Meat and Other Protein Products  Fatty cuts of meat. Ribs, chicken wings, bacon, sausage, bologna, salami, chitterlings, fatback, hot dogs, bratwurst, and packaged luncheon meats. Salted nuts and seeds. Canned beans with salt. Dairy  Whole or 2% milk, cream, half-and-half, and cream cheese. Whole-fat or sweetened yogurt. Full-fat cheeses or blue cheese. Nondairy creamers and whipped toppings. Processed cheese, cheese spreads, or cheese curds. Condiments  Onion and garlic salt, seasoned salt, table salt, and sea salt. Canned and packaged gravies. Worcestershire sauce. Tartar sauce. Barbecue sauce. Teriyaki sauce. Soy sauce, including reduced sodium. Steak sauce. Fish sauce. Oyster sauce. Cocktail sauce. Horseradish. Ketchup and mustard. Meat flavorings and tenderizers. Bouillon cubes. Hot sauce. Tabasco sauce. Marinades. Taco seasonings. Relishes. Fats and Oils  Butter, stick margarine, lard, shortening, ghee, and bacon fat. Coconut, palm kernel, or palm oils. Regular salad dressings. Other  Pickles and olives. Salted popcorn and pretzels. The items listed above may not be a complete list of foods and beverages to avoid. Contact your dietitian for more information.  Where can I find more information? National Heart, Lung, and Blood Institute: travelstabloid.com This information is not intended to replace advice given to you by your health care provider. Make sure you discuss any questions you have with your health care provider. Document Released: 08/22/2011 Document Revised: 02/08/2016 Document Reviewed: 07/07/2013 Elsevier Interactive Patient Education  2017  [employer].

## 2016-09-18 NOTE — Assessment & Plan Note (Addendum)
Patient takes Lisinopril 20 mg daily and HCTZ 25 mg daily to which he reports adherence. He last took his medications this morning. BP on arrival is 170/85 and 170/80 on repeat.  Blood pressure is uncontrolled. Will increase his Lisinopril to 40 mg daily and continue HCTZ at 25 mg daily. Check BMET today and follow up in 1 month for BP check and repeat BMET. DASH diet information provided today.

## 2016-09-18 NOTE — Progress Notes (Signed)
   CC: HTN  HPI:  [name] is a 66 y.o. male with PMH of HTN, HLD, and tobacco use who presents for follow up management of his HTN.  HTN: Patient takes Lisinopril 20 mg daily and HCTZ 25 mg daily to which he reports adherence. He last took his medications this morning. BP on arrival is 170/85 and 170/80 on repeat.  HLD: Reports inconsistently taking Atorvastatin 40 mg daily.  Constipation: Patient reports constipation 1 week ago. He called the on call resident who sent in prescriptions for Miralax and Senokot-S. He has picked up Miralax and reports a loose stool this morning. He denies any hematochezia or melena. No significant weight change. He has a good appetite. No prior colonoscopy.   Tobacco use: Patient smokes 3-4 cigarettes daily. He has thought about quitting completely and feels that he will do this on his own.  Health maintenance: Patient reports receiving the flu shot in October 2017 at work. He has not had a screening colonoscopy before.   Past Medical History:  Diagnosis Date  . Hyperlipidemia   . Hypertension     Review of Systems:   Review of Systems  Constitutional: Negative for chills and fever.  Respiratory: Negative for shortness of breath.   Cardiovascular: Negative for chest pain.  Gastrointestinal: Positive for constipation. Negative for abdominal pain, blood in stool and melena.  Neurological: Negative for dizziness, loss of consciousness and headaches.     Physical Exam:  Vitals:   09/18/16 1558  BP: (!) 170/85  Pulse: 73  Temp: 98 F (36.7 C)  TempSrc: Oral  SpO2: 99%  Weight: 190 lb (86.2 kg)   Physical Exam  Constitutional: He is oriented to person, place, and time. He appears well-developed and well-nourished. No distress.  Cardiovascular: Normal rate and regular rhythm.   No murmur heard. Pulmonary/Chest: Effort normal. No respiratory distress. He has no wheezes. He has no rales.  Abdominal: Soft. He exhibits no  distension. There is no tenderness. There is no guarding.  Musculoskeletal: Normal range of motion.  Neurological: He is alert and oriented to person, place, and time.  Skin: Skin is warm. He is not diaphoretic.    Assessment & Plan:   See Encounters Tab for problem based charting.  Patient discussed with Dr. Lynnae January

## 2016-09-19 LAB — BMP8+ANION GAP
ANION GAP: 15 mmol/L (ref 10.0–18.0)
BUN/Creatinine Ratio: 16 (ref 10–24)
BUN: 14 mg/dL (ref 8–27)
CHLORIDE: 100 mmol/L (ref 96–106)
CO2: 24 mmol/L (ref 18–29)
CREATININE: 0.87 mg/dL (ref 0.76–1.27)
Calcium: 9 mg/dL (ref 8.6–10.2)
GFR calc Af Amer: 103 mL/min/{1.73_m2} (ref >59)
GFR calc non Af Amer: 89 mL/min/{1.73_m2} (ref >59)
GLUCOSE: 98 mg/dL (ref 65–99)
POTASSIUM: 3.9 mmol/L (ref 3.5–5.2)
SODIUM: 139 mmol/L (ref 134–144)

## 2016-09-19 LAB — HEPATITIS C ANTIBODY: HEP C VIRUS AB: 0.2 {s_co_ratio} (ref 0.0–0.9)

## 2016-09-20 NOTE — Progress Notes (Signed)
[facility] Clinic Attending  Case discussed with Dr. Patel,Vishal at the time of the visit.  We reviewed the resident's history and exam and pertinent patient test results.  I agree with the assessment, diagnosis, and plan of care documented in the resident's note.  

## 2016-10-23 ENCOUNTER — Encounter (INDEPENDENT_AMBULATORY_CARE_PROVIDER_SITE_OTHER): Payer: Self-pay

## 2016-10-23 ENCOUNTER — Encounter: Payer: Self-pay | Admitting: Internal Medicine

## 2016-10-23 ENCOUNTER — Ambulatory Visit (INDEPENDENT_AMBULATORY_CARE_PROVIDER_SITE_OTHER): Payer: Medicare Other | Admitting: Internal Medicine

## 2016-10-23 VITALS — BP 153/78 | HR 86 | Temp 98.0°F | Wt 191.4 lb

## 2016-10-23 DIAGNOSIS — I10 Essential (primary) hypertension: Secondary | ICD-10-CM

## 2016-10-23 DIAGNOSIS — E785 Hyperlipidemia, unspecified: Secondary | ICD-10-CM | POA: Diagnosis not present

## 2016-10-23 DIAGNOSIS — Z79899 Other long term (current) drug therapy: Secondary | ICD-10-CM | POA: Diagnosis not present

## 2016-10-23 DIAGNOSIS — Z0000 Encounter for general adult medical examination without abnormal findings: Secondary | ICD-10-CM

## 2016-10-23 DIAGNOSIS — F17210 Nicotine dependence, cigarettes, uncomplicated: Secondary | ICD-10-CM | POA: Diagnosis not present

## 2016-10-23 DIAGNOSIS — F17200 Nicotine dependence, unspecified, uncomplicated: Secondary | ICD-10-CM

## 2016-10-23 NOTE — Progress Notes (Signed)
   CC: HTN  HPI:  [name] is a 66 y.o. male with PMH of HTN, HLD, and tobacco use who presents for follow up management of his HTN.  HTN: Patient takes Lisinopril 40 mg daily (increased from 20 mg daily on last visit) and HCTZ 25 mg daily to which he reports adherence without complication. His BP this visit is 159/89. He reports that he is still working on improving his diet. He has not started an exercise regimen, but has plans to introduce this into his daily activities. He has the DASH diet information I provided him on last visit.  HLD: Reports he is now taking Atorvastatin 40 mg daily on a consistent basis.  Tobacco use: Patient has cut back to about 2 cigarettes daily. He ist planning on quitting completely and feels that he will do this on his own without NRT. He has the quitline number I provided on last visit.  Health maintenance: Patient referred for screening Colonoscopy on last visit, referral still in process. He declines the PCV13 vaccine today, but will think about this for next visit.  Past Medical History:  Diagnosis Date  . Hyperlipidemia   . Hypertension     Review of Systems:   Review of Systems  Constitutional: Negative for chills, fever and malaise/fatigue.  Respiratory: Negative for cough, hemoptysis, sputum production and shortness of breath.   Cardiovascular: Negative for chest pain, palpitations and leg swelling.  Gastrointestinal: Negative for abdominal pain, blood in stool, constipation, diarrhea, melena, nausea and vomiting.  Genitourinary: Negative for dysuria.  Musculoskeletal: Negative for myalgias.  Neurological: Negative for dizziness and loss of consciousness.     Physical Exam:  Vitals:   10/23/16 1445 10/23/16 1500  BP: (!) 159/89 (!) 153/78  Pulse: 83 86  Temp: 98 F (36.7 C)   TempSrc: Oral   Weight: 191 lb 6.4 oz (86.8 kg)    Physical Exam  Constitutional: He is oriented to person, place, and time. He appears  well-developed and well-nourished. No distress.  Pleasant man  HENT:  Head: Normocephalic and atraumatic.  Cardiovascular: Normal rate and regular rhythm.   No murmur heard. Pulmonary/Chest: Effort normal. No respiratory distress. He has no wheezes. He has no rales.  Musculoskeletal: Normal range of motion. He exhibits no edema or tenderness.  Neurological: He is alert and oriented to person, place, and time.  Skin: He is not diaphoretic.    Assessment & Plan:   See Encounters Tab for problem based charting.  Patient discussed with Dr. Dareen Piano

## 2016-10-23 NOTE — Patient Instructions (Addendum)
It was a pleasure to see you again Samuel Weiss.  I think you are doing a good job with your health!  Please continue to cut back on smoking. Call us or 1-800-QUIT-NOW if needed for further assistance.  Your Blood Pressure is a little high at 153/78. Please try to work on exercise and diet to help bring this down. Alcohol and tobacco can also increase the blood pressure.  Continue your current medications as prescribed.  We are rechecking your kidney and potassium numbers today to make sure they are stable.  It does not sound like you have any symptoms for diabetes, so there is not really any indication to check for this today. I will provide some information about diabetes below for you to read.  Please follow up with me in 6 months or sooner if needed.    Preventing Type 2 Diabetes Mellitus Type 2 diabetes (type 2 diabetes mellitus) is a long-term (chronic) disease that affects blood sugar (glucose) levels. Normally, a hormone called insulin allows glucose to enter cells in the body. The cells use glucose for energy. In type 2 diabetes, one or both of these problems may be present:  The body does not make enough insulin.  The body does not respond properly to insulin that it makes (insulin resistance). Insulin resistance or lack of insulin causes excess glucose to build up in the blood instead of going into cells. As a result, high blood glucose (hyperglycemia) develops, which can cause many complications. Being overweight or obese and having an inactive (sedentary) lifestyle can increase your risk for diabetes. Type 2 diabetes can be delayed or prevented by making certain nutrition and lifestyle changes. What nutrition changes can be made?  Eat healthy meals and snacks regularly. Keep a healthy snack with you for when you get hungry between meals, such as fruit or a handful of nuts.  Eat lean meats and proteins that are low in saturated fats, such as chicken, fish, egg whites, and  beans. Avoid processed meats.  Eat plenty of fruits and vegetables and plenty of grains that have not been processed (whole grains). It is recommended that you eat:  1?2 cups of fruit every day.  2?3 cups of vegetables every day.  6?8 oz of whole grains every day, such as oats, whole wheat, bulgur, brown rice, quinoa, and millet.  Eat low-fat dairy products, such as milk, yogurt, and cheese.  Eat foods that contain healthy fats, such as nuts, avocado, olive oil, and canola oil.  Drink water throughout the day. Avoid drinks that contain added sugar, such as soda or sweet tea.  Follow instructions from your health care provider about specific eating or drinking restrictions.  Control how much food you eat at a time (portion size).  Check food labels to find out the serving sizes of foods.  Use a kitchen scale to weigh amounts of foods.  Saute or steam food instead of frying it. Cook with water or broth instead of oils or butter.  Limit your intake of:  Salt (sodium). Have no more than 1 tsp (2,400 mg) of sodium a day. If you have heart disease or high blood pressure, have less than ? tsp (1,500 mg) of sodium a day.  Saturated fat. This is fat that is solid at room temperature, such as butter or fat on meat. What lifestyle changes can be made?  Activity  Do moderate-intensity physical activity for at least 30 minutes on at least 5 days of the week, or  as much as told by your health care provider.  Ask your health care provider what activities are safe for you. A mix of physical activities may be best, such as walking, swimming, cycling, and strength training.  Try to add physical activity into your day. For example:  Park in spots that are farther away than usual, so that you walk more. For example, park in a far corner of the parking lot when you go to the office or the grocery store.  Take a walk during your lunch break.  Use stairs instead of elevators or  escalators. Weight Loss  Lose weight as directed. Your health care provider can determine how much weight loss is best for you and can help you lose weight safely.  If you are overweight or obese, you may be instructed to lose at least 5?7 % of your body weight. Alcohol and Tobacco   Limit alcohol intake to no more than 1 drink a day for nonpregnant women and 2 drinks a day for men. One drink equals 12 oz of beer, 5 oz of wine, or 1 oz of hard liquor.  Do not use any tobacco products, such as cigarettes, chewing tobacco, and e-cigarettes. If you need help quitting, ask your health care provider. Work With [place] Provider  Have your blood glucose tested regularly, as told by your health care provider.  Discuss your risk factors and how you can reduce your risk for diabetes.  Get screening tests as told by your health care provider. You may have screening tests regularly, especially if you have certain risk factors for type 2 diabetes.  Make an appointment with a diet and nutrition specialist (registered dietitian). A registered dietitian can help you make a healthy eating plan and can help you understand portion sizes and food labels. Why are these changes important?  It is possible to prevent or delay type 2 diabetes and related health problems by making lifestyle and nutrition changes.  It can be difficult to recognize signs of type 2 diabetes. The best way to avoid possible damage to your body is to take actions to prevent the disease before you develop symptoms. What can happen if changes are not made?  Your blood glucose levels may keep increasing. Having high blood glucose for a long time is dangerous. Too much glucose in your blood can damage your blood vessels, heart, kidneys, nerves, and eyes.  You may develop prediabetes or type 2 diabetes. Type 2 diabetes can lead to many chronic health problems and complications, such as:  Heart  disease.  Stroke.  Blindness.  Kidney disease.  Depression.  Poor circulation in the feet and legs, which could lead to surgical removal (amputation) in severe cases. Where to find support:  Ask your health care provider to recommend a registered dietitian, diabetes educator, or weight loss program.  Look for local or online weight loss groups.  Join a gym, fitness club, or outdoor activity group, such as a walking club. Where to find more information: To learn more about diabetes and diabetes prevention, visit:  American Diabetes Association (ADA): www.diabetes.[employer] of Diabetes and Digestive and Kidney Diseases: FindSpin.nl To learn more about healthy eating, visit:  The U.S. Department of Agriculture Scientist, research (physical sciences)), Choose My Plate: http://wiley-williams.com/  Office of Disease Prevention and Health Promotion (ODPHP), Dietary Guidelines: SurferLive.at Summary  You can reduce your risk for type 2 diabetes by increasing your physical activity, eating healthy foods, and losing weight as directed.  Talk  with your health care provider about your risk for type 2 diabetes. Ask about any blood tests or screening tests that you need to have. This information is not intended to replace advice given to you by your health care provider. Make sure you discuss any questions you have with your health care provider. Document Released: 12/25/2015 Document Revised: 02/08/2016 Document Reviewed: 10/24/2015 Elsevier Interactive Patient Education  2017 [employer].

## 2016-10-24 LAB — BMP8+ANION GAP
ANION GAP: 17 mmol/L (ref 10.0–18.0)
BUN/Creatinine Ratio: 17 (ref 10–24)
BUN: 20 mg/dL (ref 8–27)
CO2: 24 mmol/L (ref 18–29)
Calcium: 9.4 mg/dL (ref 8.6–10.2)
Chloride: 100 mmol/L (ref 96–106)
Creatinine, Ser: 1.16 mg/dL (ref 0.76–1.27)
GFR calc Af Amer: 75 mL/min/{1.73_m2} (ref >59)
GFR calc non Af Amer: 65 mL/min/{1.73_m2} (ref >59)
GLUCOSE: 95 mg/dL (ref 65–99)
POTASSIUM: 3.7 mmol/L (ref 3.5–5.2)
SODIUM: 141 mmol/L (ref 134–144)

## 2016-10-28 NOTE — Assessment & Plan Note (Signed)
Patient has cut back to about 2 cigarettes daily. He ist planning on quitting completely and feels that he will do this on his own without NRT. He has the quitline number I provided on last visit.  Patient congratulated and encouraged on his progress towards smoking cessation. He has declined nicotine replacement therapy

## 2016-10-28 NOTE — Assessment & Plan Note (Signed)
Reports he is now taking Atorvastatin 40 mg daily on a consistent basis. Will continue at current dose.

## 2016-10-28 NOTE — Assessment & Plan Note (Signed)
Patient takes Lisinopril 40 mg daily (increased from 20 mg daily on last visit) and HCTZ 25 mg daily to which he reports adherence without complication. His BP this visit is 159/89. He reports that he is still working on improving his diet. He has not started an exercise regimen, but has plans to introduce this into his daily activities. He has the DASH diet information I provided him on last visit.  Repeat BP is 153/78, slightly elevated. Patient has felt motivated to work towards improving his diet and exercise, so at this time we have opted to continue with the current medical therapy and continue to monitor his BP. If needed, we have room to go up on his HCTZ or choose to add on Amlodipine. -Continue Lisinopril 40 mg daily -Continue HCTZ 25 mg daily -Continue plans for diet/exercise -Smoking cessation -Repeat BMET is within normal limits

## 2016-10-28 NOTE — Assessment & Plan Note (Signed)
Patient referred for screening Colonoscopy on last visit, referral still in process. He declines the PCV13 vaccine today, but will think about this for next visit. -f/u GI referral for screening Colonoscopy -Readdress PCV13 on next visit

## 2016-10-30 NOTE — Progress Notes (Signed)
[facility] Clinic Attending  Case discussed with Dr. Patel,Vishal at the time of the visit.  We reviewed the resident's history and exam and pertinent patient test results.  I agree with the assessment, diagnosis, and plan of care documented in the resident's note.  

## 2017-07-01 ENCOUNTER — Ambulatory Visit: Payer: Medicare Other

## 2017-07-10 ENCOUNTER — Encounter: Payer: Self-pay | Admitting: Internal Medicine

## 2017-07-10 ENCOUNTER — Ambulatory Visit: Payer: Medicare Other

## 2017-08-27 ENCOUNTER — Ambulatory Visit: Payer: Medicare Other | Admitting: Internal Medicine

## 2017-10-22 ENCOUNTER — Other Ambulatory Visit: Payer: Self-pay | Admitting: *Deleted

## 2017-10-22 DIAGNOSIS — E785 Hyperlipidemia, unspecified: Secondary | ICD-10-CM

## 2017-10-22 DIAGNOSIS — I10 Essential (primary) hypertension: Secondary | ICD-10-CM

## 2017-10-22 MED ORDER — LISINOPRIL 40 MG PO TABS: 40.0000 mg | ORAL_TABLET | Freq: Every day | ORAL | 0 refills | Status: DC

## 2017-10-22 MED ORDER — HYDROCHLOROTHIAZIDE 12.5 MG PO CAPS: 25.0000 mg | ORAL_CAPSULE | Freq: Every day | ORAL | 0 refills | Status: DC

## 2017-10-22 MED ORDER — ATORVASTATIN CALCIUM 40 MG PO TABS: 40.0000 mg | ORAL_TABLET | Freq: Every day | ORAL | 0 refills | Status: DC

## 2017-11-19 ENCOUNTER — Encounter: Payer: Self-pay | Admitting: Internal Medicine

## 2017-11-19 ENCOUNTER — Ambulatory Visit (INDEPENDENT_AMBULATORY_CARE_PROVIDER_SITE_OTHER): Payer: Medicare Other | Admitting: Internal Medicine

## 2017-11-19 ENCOUNTER — Other Ambulatory Visit: Payer: Self-pay

## 2017-11-19 ENCOUNTER — Encounter (INDEPENDENT_AMBULATORY_CARE_PROVIDER_SITE_OTHER): Payer: Self-pay

## 2017-11-19 VITALS — BP 167/92 | HR 79 | Temp 97.9°F | Ht 68.0 in | Wt 195.4 lb

## 2017-11-19 DIAGNOSIS — E785 Hyperlipidemia, unspecified: Secondary | ICD-10-CM | POA: Diagnosis not present

## 2017-11-19 DIAGNOSIS — F17210 Nicotine dependence, cigarettes, uncomplicated: Secondary | ICD-10-CM

## 2017-11-19 DIAGNOSIS — Z9114 Patient's other noncompliance with medication regimen: Secondary | ICD-10-CM | POA: Diagnosis not present

## 2017-11-19 DIAGNOSIS — I10 Essential (primary) hypertension: Secondary | ICD-10-CM | POA: Diagnosis not present

## 2017-11-19 DIAGNOSIS — F17200 Nicotine dependence, unspecified, uncomplicated: Secondary | ICD-10-CM

## 2017-11-19 DIAGNOSIS — Z79899 Other long term (current) drug therapy: Secondary | ICD-10-CM

## 2017-11-19 DIAGNOSIS — Z0000 Encounter for general adult medical examination without abnormal findings: Secondary | ICD-10-CM

## 2017-11-19 MED ORDER — OLMESARTAN-AMLODIPINE-HCTZ 40-5-25 MG PO TABS: ORAL_TABLET | ORAL | 0 refills | Status: DC

## 2017-11-19 NOTE — Assessment & Plan Note (Signed)
He continues to smoke, one pack lasts him 4-5 days.  He would like to work on cutting back on his own. A/P: Counseled on smoking cessation for current tobacco use.  Advised he can contact us for additional help if needed.

## 2017-11-19 NOTE — Assessment & Plan Note (Addendum)
He is currently prescribed lisinopril 40 mg daily and HCTZ 25 mg daily.  He reports fair adherence but does admit to occasional missed doses including this morning and one missed dose yesterday.  His blood pressure on arrival today is 184/99 and 167/92 on repeat.  He does think that high salt in his diet is playing a role BP Readings from Last 3 Encounters:  11/19/17 (!) 167/92  10/23/16 (!) 153/78  09/18/16 (!) 170/85  A/P: His blood pressure remains elevated although he does admit to inconsistent adherence.  We will switch his medications to combination therapy to help with adherence and improved blood pressure control. -Start olmesartan-amlodipine-HCTZ 40-5-20 5 mg once daily -Stop lisinopril -Check BMET -DASH diet -f/u in 4-6 weeks for recheck

## 2017-11-19 NOTE — Patient Instructions (Signed)
It was a pleasure to see you again Samuel Weiss.  We are adjusting your blood pressure medications as discussed. This will be one pill (3 medications in one) once a day.  Please limit salt in the diet as this can increase the blood pressure.  Follow up with [place] in 4-6 weeks for a blood pressure recheck.   DASH Eating Plan DASH stands for "Dietary Approaches to Stop Hypertension." The DASH eating plan is a healthy eating plan that has been shown to reduce high blood pressure (hypertension). It may also reduce your risk for type 2 diabetes, heart disease, and stroke. The DASH eating plan may also help with weight loss. What are tips for following this plan? General guidelines  Avoid eating more than 2,300 mg (milligrams) of salt (sodium) a day. If you have hypertension, you may need to reduce your sodium intake to 1,500 mg a day.  Limit alcohol intake to no more than 1 drink a day for nonpregnant women and 2 drinks a day for men. One drink equals 12 oz of beer, 5 oz of wine, or 1 oz of hard liquor.  Work with your health care provider to maintain a healthy body weight or to lose weight. Ask what an ideal weight is for you.  Get at least 30 minutes of exercise that causes your heart to beat faster (aerobic exercise) most days of the week. Activities may include walking, swimming, or biking.  Work with your health care provider or diet and nutrition specialist (dietitian) to adjust your eating plan to your individual calorie needs. Reading food labels  Check food labels for the amount of sodium per serving. Choose foods with less than 5 percent of the Daily Value of sodium. Generally, foods with less than 300 mg of sodium per serving fit into this eating plan.  To find whole grains, look for the word "whole" as the first word in the ingredient list. Shopping  Buy products labeled as "low-sodium" or "no salt added."  Buy fresh foods. Avoid canned foods and premade or frozen  meals. Cooking  Avoid adding salt when cooking. Use salt-free seasonings or herbs instead of table salt or sea salt. Check with your health care provider or pharmacist before using salt substitutes.  Do not fry foods. Cook foods using healthy methods such as baking, boiling, grilling, and broiling instead.  Cook with heart-healthy oils, such as olive, canola, soybean, or sunflower oil. Meal planning   Eat a balanced diet that includes: ? 5 or more servings of fruits and vegetables each day. At each meal, try to fill half of your plate with fruits and vegetables. ? Up to 6-8 servings of whole grains each day. ? Less than 6 oz of lean meat, poultry, or fish each day. A 3-oz serving of meat is about the same size as a deck of cards. One egg equals 1 oz. ? 2 servings of low-fat dairy each day. ? A serving of nuts, seeds, or beans 5 times each week. ? Heart-healthy fats. Healthy fats called Omega-3 fatty acids are found in foods such as flaxseeds and coldwater fish, like sardines, salmon, and mackerel.  Limit how much you eat of the following: ? Canned or prepackaged foods. ? Food that is high in trans fat, such as fried foods. ? Food that is high in saturated fat, such as fatty meat. ? Sweets, desserts, sugary drinks, and other foods with added sugar. ? Full-fat dairy products.  Do not salt foods before eating.  Try to eat at least 2 vegetarian meals each week.  Eat more home-cooked food and less restaurant, buffet, and fast food.  When eating at a restaurant, ask that your food be prepared with less salt or no salt, if possible. What foods are recommended? The items listed may not be a complete list. Talk with your dietitian about what dietary choices are best for you. Grains Whole-grain or whole-wheat bread. Whole-grain or whole-wheat pasta. Brown rice. [place]. Bulgur. Whole-grain and low-sodium cereals. Pita bread. Low-fat, low-sodium crackers. Whole-wheat flour  tortillas. Vegetables Fresh or frozen vegetables (raw, steamed, roasted, or grilled). Low-sodium or reduced-sodium tomato and vegetable juice. Low-sodium or reduced-sodium tomato sauce and tomato paste. Low-sodium or reduced-sodium canned vegetables. Fruits All fresh, dried, or frozen fruit. Canned fruit in natural juice (without added sugar). Meat and other protein foods Skinless chicken or [place]. Ground chicken or [place]. Pork with fat trimmed off. Fish and seafood. Egg whites. Dried beans, peas, or lentils. Unsalted nuts, nut butters, and seeds. Unsalted canned beans. Lean cuts of beef with fat trimmed off. Low-sodium, lean deli meat. Dairy Low-fat (1%) or fat-free (skim) milk. Fat-free, low-fat, or reduced-fat cheeses. Nonfat, low-sodium ricotta or cottage cheese. Low-fat or nonfat yogurt. Low-fat, low-sodium cheese. Fats and oils Soft margarine without trans fats. Vegetable oil. Low-fat, reduced-fat, or light mayonnaise and salad dressings (reduced-sodium). Canola, safflower, olive, soybean, and sunflower oils. Avocado. Seasoning and other foods Herbs. Spices. Seasoning mixes without salt. Unsalted popcorn and pretzels. Fat-free sweets. What foods are not recommended? The items listed may not be a complete list. Talk with your dietitian about what dietary choices are best for you. Grains Baked goods made with fat, such as croissants, muffins, or some breads. Dry pasta or rice meal packs. Vegetables Creamed or fried vegetables. Vegetables in a cheese sauce. Regular canned vegetables (not low-sodium or reduced-sodium). Regular canned tomato sauce and paste (not low-sodium or reduced-sodium). Regular tomato and vegetable juice (not low-sodium or reduced-sodium). Angie Fava. Olives. Fruits Canned fruit in a light or heavy syrup. Fried fruit. Fruit in cream or butter sauce. Meat and other protein foods Fatty cuts of meat. Ribs. Fried meat. Berniece Salines. Sausage. Bologna and other processed lunch meats.  Salami. Fatback. Hotdogs. Bratwurst. Salted nuts and seeds. Canned beans with added salt. Canned or smoked fish. Whole eggs or egg yolks. Chicken or [place] with skin. Dairy Whole or 2% milk, cream, and half-and-half. Whole or full-fat cream cheese. Whole-fat or sweetened yogurt. Full-fat cheese. Nondairy creamers. Whipped toppings. Processed cheese and cheese spreads. Fats and oils Butter. Stick margarine. Lard. Shortening. Ghee. Bacon fat. Tropical oils, such as coconut, palm kernel, or palm oil. Seasoning and other foods Salted popcorn and pretzels. Onion salt, garlic salt, seasoned salt, table salt, and sea salt. Worcestershire sauce. Tartar sauce. Barbecue sauce. Teriyaki sauce. Soy sauce, including reduced-sodium. Steak sauce. Canned and packaged gravies. Fish sauce. Oyster sauce. Cocktail sauce. Horseradish that you find on the shelf. Ketchup. Mustard. Meat flavorings and tenderizers. Bouillon cubes. Hot sauce and Tabasco sauce. Premade or packaged marinades. Premade or packaged taco seasonings. Relishes. Regular salad dressings. Where to find more information:  National Heart, Lung, and [place]: https://wilson-eaton.com/  American Heart Association: www.heart.org Summary  The DASH eating plan is a healthy eating plan that has been shown to reduce high blood pressure (hypertension). It may also reduce your risk for type 2 diabetes, heart disease, and stroke.  With the DASH eating plan, you should limit salt (sodium) intake to 2,300 mg a day. If  you have hypertension, you may need to reduce your sodium intake to 1,500 mg a day.  When on the DASH eating plan, aim to eat more fresh fruits and vegetables, whole grains, lean proteins, low-fat dairy, and heart-healthy fats.  Work with your health care provider or diet and nutrition specialist (dietitian) to adjust your eating plan to your individual calorie needs. This information is not intended to replace advice given to you by your health  care provider. Make sure you discuss any questions you have with your health care provider. Document Released: 08/22/2011 Document Revised: 08/26/2016 Document Reviewed: 08/26/2016 Elsevier Interactive Patient Education  [employer].

## 2017-11-19 NOTE — Progress Notes (Signed)
   CC: HTN  HPI:  [name] is a 67 y.o. male with HTN, HLD, and tobacco use who presents for follow up management of HTN.  Please see problem based on process of patient's chronic medical issues.  Essential hypertension He is currently prescribed lisinopril 40 mg daily and HCTZ 25 mg daily.  He reports fair adherence but does admit to occasional missed doses including this morning and one missed dose yesterday.  His blood pressure on arrival today is 184/99 and 167/92 on repeat.  He does think that high salt in his diet is playing a role BP Readings from Last 3 Encounters:  11/19/17 (!) 167/92  10/23/16 (!) 153/78  09/18/16 (!) 170/85  A/P: His blood pressure remains elevated although he does admit to inconsistent adherence.  We will switch his medications to combination therapy to help with adherence and improved blood pressure control. -Start olmesartan-amlodipine-HCTZ 40-5-20 5 mg once daily -Stop lisinopril -Check BMET -DASH diet -f/u in 4-6 weeks for recheck   Tobacco use disorder He continues to smoke, one pack lasts him 4-5 days.  He would like to work on cutting back on his own. A/P: Counseled on smoking cessation for current tobacco use.  Advised he can contact us for additional help if needed.  Healthcare maintenance He has deferred recommended vaccinations today as well as referral for screening colonoscopy.    Past Medical History:  Diagnosis Date  . Hyperlipidemia   . Hypertension    Review of Systems:   Review of Systems  Respiratory: Negative for shortness of breath.   Cardiovascular: Negative for chest pain and leg swelling.  Neurological: Negative for dizziness and loss of consciousness.    Physical Exam:  Vitals:   11/19/17 1425 11/19/17 1435  BP: (!) 184/99 (!) 167/92  Pulse: 91 79  Temp: 97.9 F (36.6 C)   TempSrc: Oral   SpO2: 99%   Weight: 195 lb 6.4 oz (88.6 kg)   Height: 5\' 8"  (1.727 m)    Physical Exam  Constitutional: He  is oriented to person, place, and time. He appears well-developed and well-nourished. No distress.  HENT:  Head: Normocephalic and atraumatic.  Cardiovascular: Normal rate and regular rhythm.  No murmur heard. Pulmonary/Chest: Effort normal. No respiratory distress. He has no wheezes. He has no rales.  Musculoskeletal: He exhibits no edema.  Neurological: He is alert and oriented to person, place, and time.  Skin: Skin is warm. He is not diaphoretic.    Assessment & Plan:   See Encounters Tab for problem based charting.  Patient discussed with Dr. Lynnae January

## 2017-11-19 NOTE — Assessment & Plan Note (Signed)
He has deferred recommended vaccinations today as well as referral for screening colonoscopy.

## 2017-11-20 LAB — BMP8+ANION GAP
ANION GAP: 15 mmol/L (ref 10.0–18.0)
BUN/Creatinine Ratio: 12 (ref 10–24)
BUN: 13 mg/dL (ref 8–27)
CO2: 26 mmol/L (ref 20–29)
Calcium: 9.6 mg/dL (ref 8.6–10.2)
Chloride: 99 mmol/L (ref 96–106)
Creatinine, Ser: 1.11 mg/dL (ref 0.76–1.27)
GFR calc Af Amer: 78 mL/min/{1.73_m2} (ref >59)
GFR, EST NON AFRICAN AMERICAN: 68 mL/min/{1.73_m2} (ref >59)
Glucose: 90 mg/dL (ref 65–99)
POTASSIUM: 4.7 mmol/L (ref 3.5–5.2)
SODIUM: 140 mmol/L (ref 134–144)

## 2017-11-28 NOTE — Progress Notes (Signed)
Internal Medicine Clinic Attending  Case discussed with Dr. Patel at the time of the visit.  We reviewed the resident's history and exam and pertinent patient test results.  I agree with the assessment, diagnosis, and plan of care documented in the resident's note.  

## 2017-12-19 ENCOUNTER — Encounter: Payer: Self-pay | Admitting: Internal Medicine

## 2017-12-19 ENCOUNTER — Ambulatory Visit (INDEPENDENT_AMBULATORY_CARE_PROVIDER_SITE_OTHER): Payer: Medicare Other | Admitting: Internal Medicine

## 2017-12-19 ENCOUNTER — Other Ambulatory Visit: Payer: Self-pay

## 2017-12-19 VITALS — BP 136/67 | HR 95 | Temp 97.6°F | Wt 196.2 lb

## 2017-12-19 DIAGNOSIS — F17210 Nicotine dependence, cigarettes, uncomplicated: Secondary | ICD-10-CM | POA: Diagnosis not present

## 2017-12-19 DIAGNOSIS — I10 Essential (primary) hypertension: Secondary | ICD-10-CM

## 2017-12-19 DIAGNOSIS — Z79899 Other long term (current) drug therapy: Secondary | ICD-10-CM

## 2017-12-19 DIAGNOSIS — F17200 Nicotine dependence, unspecified, uncomplicated: Secondary | ICD-10-CM

## 2017-12-19 DIAGNOSIS — E785 Hyperlipidemia, unspecified: Secondary | ICD-10-CM

## 2017-12-19 NOTE — Assessment & Plan Note (Addendum)
Patient is here for blood pressure follow-up. He was previously uncontrolled on lisinopril 40 mg daily and HCTZ 25 mg daily. He was changed to combination of olmesartan-amlodipine-HCTZ 40-5-5 mg daily one month ago. He has been tolerating this medicine well. Blood pressure today is 136/67. He is asymptomatic. -- Continue current regimen -- Repeat BMP today  -- Follow up 3 months   ADDENDUM: Small increase in creatinine since starting new regimen, SCr 1.11 -> 1.29. Recommend repeat BMP at follow up.

## 2017-12-19 NOTE — Progress Notes (Signed)
   CC: HTN follow up  HPI:  Samuel Weiss is a 67 y.o. male with past medical history outlined below here for HTN follow up. For the details of today's visit, please refer to the assessment and plan.  Past Medical History:  Diagnosis Date  . Hyperlipidemia   . Hypertension    Review of Systems  Respiratory: Negative for shortness of breath.   Cardiovascular: Negative for chest pain.    Physical Exam:  Vitals:   12/19/17 1321  BP: 136/67  Pulse: 95  Temp: 97.6 F (36.4 C)  TempSrc: Oral  SpO2: 100%  Weight: 196 lb 3.2 oz (89 kg)    Constitutional: NAD, appears comfortable Cardiovascular: RRR, no murmurs, rubs, or gallops.  Pulmonary/Chest: CTAB, no wheezes, rales, or rhonchi.  Extremities: Warm and well perfused. No edema.  Psychiatric: Normal mood and affect  Assessment & Plan:   See Encounters Tab for problem based charting.  Patient discussed with Dr. Angelia Mould

## 2017-12-19 NOTE — Assessment & Plan Note (Signed)
Lipid Panel     Component Value Date/Time   CHOL 222 (H) 10/20/2015 1540   TRIG 174 (H) 10/20/2015 1540   HDL 42 10/20/2015 1540   CHOLHDL 5.3 (H) 10/20/2015 1540   LDLCALC 145 (H) 10/20/2015 1540    Per last lipid panel checked 10/20/2015, patient has a 10 year ASCVD risk of 35%. He is prescribed atorvastatin 40 mg daily for primary prevention but reports he is not taking it. Today I discussed his overall cardiovascular risk in the setting of his tobacco abuse, weight, HTN, and elevated cholesterol. He is agreeable to restarting the medicine. Reports he does not need a refill, has his old prescription at home.  -- Restart atorvastatin 40 mg -- Repeat Lipid panel at follow up to assess LDL response  -- F/u PCP 3 months

## 2017-12-19 NOTE — Patient Instructions (Signed)
FOLLOW-UP INSTRUCTIONS When: 3 months For: PCP follow up What to bring: Medications   Samuel Weiss,  It was a pleasure to see you. Please continue to take your blood pressure medicine as previously prescribed. Please restart your cholesterol medicine, your cholesterol levels are high and you are at increased risk of having a stroke or heart attack if you do not take it. I will call you with the results of your blood work, likely on Monday. Please follow up with your primary care doctor in 3 months. If you have any questions or concerns, call our clinic at [phone] or after hours call [phone] and ask for the internal medicine resident on call. Thank you!  - Dr. Philipp Ovens

## 2017-12-19 NOTE — Assessment & Plan Note (Signed)
Actively smoking, reports 1 pack will last him a week. He has no interest in quitting at this time. -- Advised cessation

## 2017-12-20 LAB — BMP8+ANION GAP
Anion Gap: 17 mmol/L (ref 10.0–18.0)
BUN / CREAT RATIO: 14 (ref 10–24)
BUN: 18 mg/dL (ref 8–27)
CO2: 24 mmol/L (ref 20–29)
CREATININE: 1.29 mg/dL — AB (ref 0.76–1.27)
Calcium: 9.3 mg/dL (ref 8.6–10.2)
Chloride: 99 mmol/L (ref 96–106)
GFR calc Af Amer: 65 mL/min/{1.73_m2} (ref >59)
GFR calc non Af Amer: 56 mL/min/{1.73_m2} — ABNORMAL LOW (ref >59)
Glucose: 96 mg/dL (ref 65–99)
Potassium: 4 mmol/L (ref 3.5–5.2)
SODIUM: 140 mmol/L (ref 134–144)

## 2017-12-22 NOTE — Progress Notes (Signed)
Internal Medicine Clinic Attending  Case discussed with Dr. Guilloud at the time of the visit.  We reviewed the resident's history and exam and pertinent patient test results.  I agree with the assessment, diagnosis, and plan of care documented in the resident's note.  

## 2018-03-31 ENCOUNTER — Other Ambulatory Visit: Payer: Self-pay | Admitting: Internal Medicine

## 2018-03-31 ENCOUNTER — Other Ambulatory Visit: Payer: Self-pay

## 2018-03-31 DIAGNOSIS — I10 Essential (primary) hypertension: Secondary | ICD-10-CM

## 2018-03-31 NOTE — Patient Outreach (Signed)
[facility]) Care Management  03/31/2018  Samuel Weiss Aug 16, 1951 [id number]   Medication Adherence call to [name] spoke with patient he ask if we can call [facility] an order both of his medication Olmesartan/amlodipine/hctz 40/5/25 and Atorvastatin. [facility] said they will have it ready for patient to pick up.[name] is showing past due under [place].   [facility] Direct Dial [phone]  Fax [phone] [email or web address]

## 2018-04-01 NOTE — Telephone Encounter (Signed)
Refill is ok, but she does need follow up visit within the next 3 months to monitor renal function.

## 2018-04-06 ENCOUNTER — Encounter: Payer: Self-pay | Admitting: *Deleted

## 2018-04-13 ENCOUNTER — Other Ambulatory Visit: Payer: Self-pay | Admitting: Internal Medicine

## 2018-04-13 DIAGNOSIS — E785 Hyperlipidemia, unspecified: Secondary | ICD-10-CM

## 2018-05-13 ENCOUNTER — Ambulatory Visit: Payer: Medicare Other | Admitting: Podiatry

## 2018-05-14 ENCOUNTER — Ambulatory Visit: Payer: Medicare Other | Admitting: Podiatry

## 2018-05-14 ENCOUNTER — Ambulatory Visit: Payer: Self-pay | Admitting: Podiatry

## 2018-05-29 ENCOUNTER — Emergency Department (HOSPITAL_COMMUNITY): Payer: Medicare Other

## 2018-05-29 ENCOUNTER — Other Ambulatory Visit: Payer: Self-pay

## 2018-05-29 ENCOUNTER — Observation Stay (HOSPITAL_COMMUNITY)
Admission: EM | Admit: 2018-05-29 | Discharge: 2018-05-31 | Disposition: A | Discharge status: Home / Self Care | Payer: Medicare Other | Attending: Oral Surgery | Admitting: Oral Surgery

## 2018-05-29 ENCOUNTER — Encounter (HOSPITAL_COMMUNITY): Payer: Self-pay

## 2018-05-29 DIAGNOSIS — S02622A Fracture of subcondylar process of left mandible, initial encounter for closed fracture: Secondary | ICD-10-CM | POA: Diagnosis not present

## 2018-05-29 DIAGNOSIS — S02412A LeFort II fracture, initial encounter for closed fracture: Principal | ICD-10-CM | POA: Insufficient documentation

## 2018-05-29 DIAGNOSIS — Z79899 Other long term (current) drug therapy: Secondary | ICD-10-CM | POA: Insufficient documentation

## 2018-05-29 DIAGNOSIS — R937 Abnormal findings on diagnostic imaging of other parts of musculoskeletal system: Secondary | ICD-10-CM | POA: Diagnosis not present

## 2018-05-29 DIAGNOSIS — F329 Major depressive disorder, single episode, unspecified: Secondary | ICD-10-CM | POA: Insufficient documentation

## 2018-05-29 DIAGNOSIS — W1839XA Other fall on same level, initial encounter: Secondary | ICD-10-CM | POA: Diagnosis not present

## 2018-05-29 DIAGNOSIS — R001 Bradycardia, unspecified: Secondary | ICD-10-CM | POA: Diagnosis not present

## 2018-05-29 DIAGNOSIS — Z7982 Long term (current) use of aspirin: Secondary | ICD-10-CM | POA: Insufficient documentation

## 2018-05-29 DIAGNOSIS — R51 Headache: Secondary | ICD-10-CM | POA: Insufficient documentation

## 2018-05-29 DIAGNOSIS — S02611A Fracture of condylar process of right mandible, initial encounter for closed fracture: Secondary | ICD-10-CM | POA: Insufficient documentation

## 2018-05-29 DIAGNOSIS — S0266XB Fracture of symphysis of mandible, initial encounter for open fracture: Secondary | ICD-10-CM | POA: Insufficient documentation

## 2018-05-29 DIAGNOSIS — S0181XA Laceration without foreign body of other part of head, initial encounter: Secondary | ICD-10-CM | POA: Diagnosis not present

## 2018-05-29 DIAGNOSIS — S0292XA Unspecified fracture of facial bones, initial encounter for closed fracture: Secondary | ICD-10-CM | POA: Diagnosis not present

## 2018-05-29 DIAGNOSIS — K029 Dental caries, unspecified: Secondary | ICD-10-CM | POA: Diagnosis not present

## 2018-05-29 DIAGNOSIS — Z23 Encounter for immunization: Secondary | ICD-10-CM | POA: Diagnosis not present

## 2018-05-29 DIAGNOSIS — I10 Essential (primary) hypertension: Secondary | ICD-10-CM | POA: Diagnosis not present

## 2018-05-29 DIAGNOSIS — S02611B Fracture of condylar process of right mandible, initial encounter for open fracture: Secondary | ICD-10-CM | POA: Diagnosis not present

## 2018-05-29 DIAGNOSIS — Y9389 Activity, other specified: Secondary | ICD-10-CM | POA: Insufficient documentation

## 2018-05-29 DIAGNOSIS — S01512A Laceration without foreign body of oral cavity, initial encounter: Secondary | ICD-10-CM | POA: Insufficient documentation

## 2018-05-29 DIAGNOSIS — S0990XA Unspecified injury of head, initial encounter: Secondary | ICD-10-CM | POA: Diagnosis not present

## 2018-05-29 DIAGNOSIS — F17210 Nicotine dependence, cigarettes, uncomplicated: Secondary | ICD-10-CM | POA: Insufficient documentation

## 2018-05-29 DIAGNOSIS — R9431 Abnormal electrocardiogram [ECG] [EKG]: Secondary | ICD-10-CM | POA: Diagnosis not present

## 2018-05-29 DIAGNOSIS — R55 Syncope and collapse: Secondary | ICD-10-CM | POA: Diagnosis not present

## 2018-05-29 DIAGNOSIS — Z833 Family history of diabetes mellitus: Secondary | ICD-10-CM | POA: Diagnosis not present

## 2018-05-29 DIAGNOSIS — Z8249 Family history of ischemic heart disease and other diseases of the circulatory system: Secondary | ICD-10-CM | POA: Insufficient documentation

## 2018-05-29 DIAGNOSIS — S0292XB Unspecified fracture of facial bones, initial encounter for open fracture: Secondary | ICD-10-CM

## 2018-05-29 DIAGNOSIS — S02412B LeFort II fracture, initial encounter for open fracture: Secondary | ICD-10-CM | POA: Diagnosis not present

## 2018-05-29 DIAGNOSIS — S022XXA Fracture of nasal bones, initial encounter for closed fracture: Secondary | ICD-10-CM | POA: Insufficient documentation

## 2018-05-29 DIAGNOSIS — K0263 Dental caries on smooth surface penetrating into pulp: Secondary | ICD-10-CM | POA: Diagnosis not present

## 2018-05-29 DIAGNOSIS — E785 Hyperlipidemia, unspecified: Secondary | ICD-10-CM | POA: Insufficient documentation

## 2018-05-29 DIAGNOSIS — R58 Hemorrhage, not elsewhere classified: Secondary | ICD-10-CM | POA: Diagnosis not present

## 2018-05-29 LAB — CBC
HEMATOCRIT: 39.5 % (ref 39.0–52.0)
Hemoglobin: 12.5 g/dL — ABNORMAL LOW (ref 13.0–17.0)
MCH: 24.4 pg — AB (ref 26.0–34.0)
MCHC: 31.6 g/dL (ref 30.0–36.0)
MCV: 77.1 fL — AB (ref 78.0–100.0)
PLATELETS: 250 10*3/uL (ref 150–400)
RBC: 5.12 MIL/uL (ref 4.22–5.81)
RDW: 15.7 % — AB (ref 11.5–15.5)
WBC: 8.9 10*3/uL (ref 4.0–10.5)

## 2018-05-29 LAB — URINALYSIS, ROUTINE W REFLEX MICROSCOPIC
BACTERIA UA: NONE SEEN
Bilirubin Urine: NEGATIVE
GLUCOSE, UA: NEGATIVE mg/dL
KETONES UR: NEGATIVE mg/dL
Leukocytes, UA: NEGATIVE
Nitrite: NEGATIVE
PH: 5 (ref 5.0–8.0)
Protein, ur: NEGATIVE mg/dL
Specific Gravity, Urine: 1.013 (ref 1.005–1.030)

## 2018-05-29 LAB — ETHANOL: ALCOHOL ETHYL (B): 98 mg/dL — AB (ref <10)

## 2018-05-29 LAB — BASIC METABOLIC PANEL
Anion gap: 14 (ref 5–15)
BUN: 23 mg/dL (ref 8–23)
CHLORIDE: 102 mmol/L (ref 98–111)
CO2: 20 mmol/L — AB (ref 22–32)
CREATININE: 1.65 mg/dL — AB (ref 0.61–1.24)
Calcium: 9.1 mg/dL (ref 8.9–10.3)
GFR calc Af Amer: 47 mL/min — ABNORMAL LOW (ref >60)
GFR calc non Af Amer: 41 mL/min — ABNORMAL LOW (ref >60)
GLUCOSE: 117 mg/dL — AB (ref 70–99)
POTASSIUM: 3.2 mmol/L — AB (ref 3.5–5.1)
SODIUM: 136 mmol/L (ref 135–145)

## 2018-05-29 LAB — CBG MONITORING, ED: Glucose-Capillary: 116 mg/dL — ABNORMAL HIGH (ref 70–99)

## 2018-05-29 MED ORDER — LIDOCAINE HCL (PF) 1 % IJ SOLN
5.0000 mL | Freq: Once | INTRAMUSCULAR | Status: AC
  Administered 2018-05-30: 5 mL
  Filled 2018-05-29: qty 5

## 2018-05-29 MED ORDER — ACETAMINOPHEN 325 MG PO TABS
650.0000 mg | ORAL_TABLET | Freq: Once | ORAL | Status: AC
  Administered 2018-05-29: 650 mg via ORAL
  Filled 2018-05-29: qty 2

## 2018-05-29 MED ORDER — FAMOTIDINE IN NACL 20-0.9 MG/50ML-% IV SOLN
20.0000 mg | Freq: Every day | INTRAVENOUS | Status: DC
  Administered 2018-05-31: 20 mg via INTRAVENOUS
  Filled 2018-05-29 (×2): qty 50

## 2018-05-29 NOTE — H&P (Signed)
Samuel Weiss is an 67 y.o. male.   Chief Complaint: Facial Fractures HPI: Samuel Weiss is a 67 y.o. male.  He presents by ambulance after a fall questionable syncope.  He states he was drinking with some friends 100 proof for a while and then got up and started walking to his car and then he remembers waking up on the ground with blood from his head.  He sustained multiple facial fractures to which maxillofacial trauma was consulted. He is complaining of a mild headache and some soreness of his face. He he denies blurry vision/diplopia/nausea vomiting.  There is no reported seizure activity.  He is not on blood thinners.  No chest pain no shortness of breath no abdominal pain.  Denies pain to his neck.  Past Medical History:  Diagnosis Date  . Hyperlipidemia   . Hypertension     History reviewed. No pertinent surgical history.  Family History  Problem Relation Age of Onset  . Hypertension Mother   . Diabetes Mother   . Hypertension Father   . Diabetes Sister    Social History:  reports that he has been smoking cigarettes. He has been smoking about 0.15 packs per day. He uses smokeless tobacco. He reports that he drinks about 6.0 standard drinks of alcohol per week. He reports that he has current or past drug history. Drug: Marijuana.  Allergies: No Known Allergies   (Not in a hospital admission)  Results for orders placed or performed during the hospital encounter of 05/29/18 (from the past 48 hour(s))  CBG monitoring, ED     Status: Abnormal   Collection Time: 05/29/18  6:35 PM  Result Value Ref Range   Glucose-Capillary 116 (H) 70 - 99 mg/dL  Basic metabolic panel     Status: Abnormal   Collection Time: 05/29/18  6:38 PM  Result Value Ref Range   Sodium 136 135 - 145 mmol/L   Potassium 3.2 (L) 3.5 - 5.1 mmol/L   Chloride 102 98 - 111 mmol/L   CO2 20 (L) 22 - 32 mmol/L   Glucose, Bld 117 (H) 70 - 99 mg/dL   BUN 23 8 - 23 mg/dL   Creatinine, Ser 1.65 (H) 0.61 - 1.24  mg/dL   Calcium 9.1 8.9 - 10.3 mg/dL   GFR calc non Af Amer 41 (L) >60 mL/min   GFR calc Af Amer 47 (L) >60 mL/min    Comment: (NOTE) The eGFR has been calculated using the CKD EPI equation. This calculation has not been validated in all clinical situations. eGFR's persistently <60 mL/min signify possible Chronic Kidney Disease.    Anion gap 14 5 - 15    Comment: Performed at [facility] [address], [place], [place] [address]  CBC     Status: Abnormal   Collection Time: 05/29/18  6:38 PM  Result Value Ref Range   WBC 8.9 4.0 - 10.5 K/uL   RBC 5.12 4.22 - 5.81 MIL/uL   Hemoglobin 12.5 (L) 13.0 - 17.0 g/dL   HCT 39.5 39.0 - 52.0 %   MCV 77.1 (L) 78.0 - 100.0 fL   MCH 24.4 (L) 26.0 - 34.0 pg   MCHC 31.6 30.0 - 36.0 g/dL   RDW 15.7 (H) 11.5 - 15.5 %   Platelets 250 150 - 400 K/uL    Comment: Performed at [facility], [place] [address], [place], [place] [address]  Ethanol     Status: Abnormal   Collection Time: 05/29/18  6:38 PM  Result Value Ref Range   Alcohol, Ethyl (B) 98 (H) <10 mg/dL    Comment: (NOTE) Lowest detectable limit for serum alcohol is 10 mg/dL. For medical purposes only. Performed at [facility], [place] [address], [place], [place] [address]    Ct Head Wo Contrast  Addendum Date: 05/29/2018   ADDENDUM REPORT: 05/29/2018 21:06 ADDENDUM: Speech recognition error in impression. Initial impression should state "no evidence of intracranial injury". Electronically Signed   By: Nelson Chimes M.D.   On: 05/29/2018 21:06   Result Date: 05/29/2018 CLINICAL DATA:  [address] [address] with trauma to the head and face. EXAM: CT HEAD WITHOUT CONTRAST TECHNIQUE: Contiguous axial images were obtained from the base of the skull through the vertex without intravenous contrast. COMPARISON:  None. FINDINGS: Brain: The brain has normal appearance. No evidence of old or acute infarction, mass lesion, hemorrhage, hydrocephalus or extra-axial collection. No pneumocephalus. Vascular:  There is atherosclerotic calcification of the major vessels at the base of the brain. Skull: Depressed fractures of the anterior wall of the frontal sinuses. No apparent involvement of the posterior wall. Sinuses/Orbits: There is a depressed mid face fracture affecting the nasal bones and ethmoid bones, consistent with LeFort 2 fracture. Fractures also involve the anterior and posterior walls of the maxillary sinuses. Fracture involves the lateral wall of the orbit on the left. Zygomatic arches are intact. Orbital floor fractures more extensive on the left than the right. Nasal septum is bowed and fractured. Fracture of the mandibular condyle on the right and probably at the angle of the mandible on the left. I would suggest a complete facial CT when able. Other: Traumatic fluid in the sinuses as expected. Orbital emphysema and soft tissue facial emphysema. IMPRESSION: Evidence of intracranial injury. Extensive comminuted facial fractures including involvement of the anterior walls of the frontal sinus but not the posterior walls. There is no evidence any pneumocephalus or intracranial/skull base involvement. Facial fractures are probably in the LeFort 2 family. I would recommend a complete facial CT for more accurate evaluation. See above discussion for what we can tell from this head CT. Note that the fractures also involve the mandible on each side. Electronically Signed: By: Nelson Chimes M.D. On: 05/29/2018 20:29   Ct 3d Recon At Scanner  Result Date: 05/29/2018 CLINICAL DATA:  Nonspecific (abnormal) findings on radiological and other examination of musculoskeletal sysem. Multiple facial fractures, open, initial encounter. EXAM: 3-DIMENSIONAL CT IMAGE RENDERING ON ACQUISITION WORKSTATION TECHNIQUE: 3-dimensional CT images were rendered by post-processing of the original CT data on an acquisition workstation. The 3-dimensional CT images were interpreted and findings were reported in the accompanying complete  CT report for this study COMPARISON:  Face CT earlier this day. FINDINGS: 3D reconstructions of the maxillofacial region demonstrating fractures of bilateral mandibles, nasal bones, frontal sinus, and orbits. Maxillary sinus fractures were better assessed on planar imaging. IMPRESSION: 3D reconstructions of facial bone fractures. Electronically Signed   By: Keith Rake M.D.   On: 05/29/2018 22:36   Ct Maxillofacial Wo Cm  Result Date: 05/29/2018 CLINICAL DATA:  Facial injury after fall. Facial fractures on head CT. EXAM: CT MAXILLOFACIAL WITHOUT CONTRAST TECHNIQUE: Multidetector CT imaging of the maxillofacial structures was performed. Multiplanar CT image reconstructions were also generated. COMPARISON:  Head CT earlier this day. FINDINGS: Osseous: Multiple facial fractures with afford injuries. Displaced fractures through the pterygoid plates bilaterally. Suture versus nondisplaced fracture through the mid left zygomatic arch. Bilateral depressed nasal bone fractures. Bilateral mandibular  fractures with comminuted displaced fracture through the right mandibular condyle at the temporomandibular joint. Fracture through the left mandibular ramus which is essentially nondisplaced. Fracture through the anterior central mandible extends through the right central and to a lesser extent lateral incisors. Orbits: Bilateral orbital floor fractures, minimally displaced on the right and nondisplaced on the left. Additional mildly displaced lateral left orbital fracture. Retrobulbar air bilaterally, mild. No evidence of globe injury. No stranding of the extra-ocular muscles to suggest entrapment. Sinuses: Bilateral maxillary sinus fractures involving the anterior, lateral, and medial walls. Bilateral hemosinus. Depressed mid frontal sinus fracture. Soft tissues: Extensive periorbital contusion and soft tissue emphysema related to facial bone fractures. Limited intracranial: Assessed on concurrent head CT. No acute  intracranial injury. IMPRESSION: 1. Extensive bilateral facial bone fractures with bilateral LeFort injury. This represents LeFort 2 fracture on the right and LeFort 2/3 fracture on the left. Fractures involve bilateral pterygoid plates, inferior orbits, left lateral orbit, and bilateral maxillary sinuses. Suture versus nondisplaced left zygomatic fracture. Central depressed frontal sinus fracture. 2. Mandibular fractures. On the right this involves the mandibular condyle, comminuted and displaced. On the left this involves the angle. Fractures of the central mandible extends through the right lateral and central incisor. Electronically Signed   By: Keith Rake M.D.   On: 05/29/2018 21:18    ROS: Other than HPI neg for ROS.  Blood pressure (!) 166/102, pulse (!) 110, temperature 98 F (36.7 C), temperature source Oral, resp. rate (!) 27, height _0  (1.727 m), weight 87.5 kg, SpO2 95 %. Physical Exam  Gen: awake/alert, slurry speech, but coherent HEENT: PERRL, EOMI; moderate bilateral facial edema as well as mild forehead edema.  There is a 2 cm superficial horizontal laceration in his labiomental fold.  He has a small superficial abrasion to his left forehead.  There is subtle deviation of his nasal dorsum.  No apparent orbital rim step-off defects.  But no other apparent facial asymmetry.  His maxilla is unstable and mobile more so on the right side than the left.  He has multiple carious/nonrestorable teeth.  He has an anterior open bite/malocclusion.  There is no appreciated step-off defects in the mandible.  He has a deviated septum to the right.  Oropharynx clear. Heart: mild tachycardia, nl s1,s2 Lungs: CTA-B Abd: s,nt,nd Neuro: CN V2 paresthesia bilaterally  Assessment/Plan 67 y/o M s/p fall with multiple facial fractures to include: Anterior table of the frontal sinus, bilateral nasal bone fractures, LeFort II/III level fractures with comminution of the maxillary sinuses bilaterally,  fracture of the mandibular symphysis, left condyle, right subcondylar fracture, multiple carious/nonrestorable teeth.  Pt will be admitted and taken to the operating room and placed under general anesthesia for open reduction internal fixation of bilateral LeFort and anterior mandibular symphysis fractures, closed reduction of bilateral nasal bone fractures, closed reduction of bilateral condylar fractures with placement of maxillary and mandibular arch bars and maxillomandibular fixation.  Surgical extraction of indicated teeth.   Anesthesia request: general via oral RAE tube; may need to consider submental intubation.    Michael Litter, DMD  Oral & maxillofacial Surgery 05/29/2018, 11:08 PM

## 2018-05-29 NOTE — ED Notes (Signed)
Patient transported to CT 

## 2018-05-29 NOTE — ED Provider Notes (Signed)
[facility] EMERGENCY DEPARTMENT Provider Note   CSN: [id number] Arrival date & time: 05/29/18  1823     History   Chief Complaint Chief Complaint  Patient presents with  . Loss of Consciousness    HPI Samuel Weiss is a 67 y.o. male.  He presents by ambulance after a fall possibly syncope.  He states he was drinking with some friends 100 proof for a while and then got up and started walking to his car and then he remembers waking up on the ground with blood from his head.  He is complaining of a mild headache and some soreness of his face.  No double vision no blurred vision no numbness no weakness.  There is no reported seizure activity.  He is not on blood thinners.  No chest pain no shortness of breath no abdominal pain.  Denies pain to his neck or back arms or legs.  Says his last tetanus was within 10 years.  The history is provided by the patient and the EMS personnel.  Loss of Consciousness   This is a new problem. The current episode started less than 1 hour ago. The problem has been resolved. He lost consciousness for a period of less than one minute. The problem is associated with normal activity. Associated symptoms include headaches. Pertinent negatives include abdominal pain, back pain, bowel incontinence, chest pain, fever, focal weakness, nausea, seizures, slurred speech, visual change, vomiting and weakness. He has tried nothing for the symptoms. His past medical history is significant for HTN. His past medical history does not include seizures.    Past Medical History:  Diagnosis Date  . Hyperlipidemia   . Hypertension     Patient Active Problem List   Diagnosis Date Noted  . Tobacco use disorder 11/17/2015  . Essential hypertension 10/20/2015  . Healthcare maintenance 10/20/2015  . HLD (hyperlipidemia) 10/20/2015    History reviewed. No pertinent surgical history.      Home Medications    Prior to Admission medications   Medication  Sig Start Date End Date Taking? Authorizing Provider  atorvastatin (LIPITOR) 40 MG tablet TAKE 1 TABLET BY MOUTH ONCE DAILY 04/14/18   Axel Filler, MD  Olmesartan-amLODIPine-HCTZ 40-5-25 MG TABS TAKE 1 TABLET BY MOUTH ONCE DAILY 04/01/18   Axel Filler, MD    Family History Family History  Problem Relation Age of Onset  . Hypertension Mother   . Diabetes Mother   . Hypertension Father   . Diabetes Sister     Social History Social History   Tobacco Use  . Smoking status: Light Tobacco Smoker    Packs/day: 0.15    Types: Cigarettes  . Smokeless tobacco: Current User  . Tobacco comment: 1 pack 3-4 days   Substance Use Topics  . Alcohol use: Yes    Alcohol/week: 6.0 standard drinks    Types: 6 Cans of beer per week  . Drug use: Yes    Types: Marijuana     Allergies   Patient has no known allergies.   Review of Systems Review of Systems  Constitutional: Negative for fever.  HENT: Negative for sore throat.   Eyes: Negative for visual disturbance.  Respiratory: Negative for shortness of breath.   Cardiovascular: Positive for syncope. Negative for chest pain.  Gastrointestinal: Negative for abdominal pain, bowel incontinence, nausea and vomiting.  Genitourinary: Negative for dysuria.  Musculoskeletal: Negative for back pain and neck pain.  Skin: Positive for wound. Negative for rash.  Neurological:  Positive for headaches. Negative for focal weakness, seizures and weakness.     Physical Exam Updated Vital Signs BP (!) 146/90 (BP Location: Right Arm)   Pulse 87   Temp 98 F (36.7 C) (Oral)   Resp 15   SpO2 98%   Physical Exam  Constitutional: He is oriented to person, place, and time. He appears well-developed and well-nourished.  HENT:  Head: Normocephalic.  Right Ear: External ear normal.  Left Ear: External ear normal.  Mouth/Throat: Oropharynx is clear and moist.  He is got some forehead swelling and a few abrasions.  He has a 3 cm  laceration of his chin.  He also bit the tip of his tongue and is missing about a half a centimeter square of tissue with no active bleeding.  Eyes: Conjunctivae are normal.  Neck: Neck supple.  Cardiovascular: Normal rate and regular rhythm.  No murmur heard. Pulmonary/Chest: Effort normal and breath sounds normal. No respiratory distress.  Abdominal: Soft. There is no tenderness.  Musculoskeletal: Normal range of motion. He exhibits no edema, tenderness or deformity.  Neurological: He is alert and oriented to person, place, and time. He has normal strength. GCS eye subscore is 4. GCS verbal subscore is 5. GCS motor subscore is 6.  Skin: Skin is warm and dry.  Psychiatric: He has a normal mood and affect.  Nursing note and vitals reviewed.    ED Treatments / Results  Labs (all labs ordered are listed, but only abnormal results are displayed) Labs Reviewed  SURGICAL PCR SCREEN - Abnormal; Notable for the following components:      Result Value   Staphylococcus aureus POSITIVE (*)    All other components within normal limits  BASIC METABOLIC PANEL - Abnormal; Notable for the following components:   Potassium 3.2 (*)    CO2 20 (*)    Glucose, Bld 117 (*)    Creatinine, Ser 1.65 (*)    GFR calc non Af Amer 41 (*)    GFR calc Af Amer 47 (*)    All other components within normal limits  CBC - Abnormal; Notable for the following components:   Hemoglobin 12.5 (*)    MCV 77.1 (*)    MCH 24.4 (*)    RDW 15.7 (*)    All other components within normal limits  URINALYSIS, ROUTINE W REFLEX MICROSCOPIC - Abnormal; Notable for the following components:   Hgb urine dipstick MODERATE (*)    All other components within normal limits  ETHANOL - Abnormal; Notable for the following components:   Alcohol, Ethyl (B) 98 (*)    All other components within normal limits  CBG MONITORING, ED - Abnormal; Notable for the following components:   Glucose-Capillary 116 (*)    All other components within  normal limits    EKG EKG Interpretation  Date/Time:  Friday May 29 2018 18:26:41 EDT Ventricular Rate:  89 PR Interval:    QRS Duration: 89 QT Interval:  377 QTC Calculation: 459 R Axis:   55 Text Interpretation:  Sinus rhythm Borderline T wave abnormalities no prior to compare with Confirmed by Aletta Edouard [phone]) on 05/29/2018 6:35:55 PM   Radiology Ct Head Wo Contrast  Addendum Date: 05/29/2018   ADDENDUM REPORT: 05/29/2018 21:06 ADDENDUM: Speech recognition error in impression. Initial impression should state "no evidence of intracranial injury". Electronically Signed   By: Nelson Chimes M.D.   On: 05/29/2018 21:06   Result Date: 05/29/2018 CLINICAL DATA:  [address] with trauma to the head  and face. EXAM: CT HEAD WITHOUT CONTRAST TECHNIQUE: Contiguous axial images were obtained from the base of the skull through the vertex without intravenous contrast. COMPARISON:  None. FINDINGS: Brain: The brain has normal appearance. No evidence of old or acute infarction, mass lesion, hemorrhage, hydrocephalus or extra-axial collection. No pneumocephalus. Vascular: There is atherosclerotic calcification of the major vessels at the base of the brain. Skull: Depressed fractures of the anterior wall of the frontal sinuses. No apparent involvement of the posterior wall. Sinuses/Orbits: There is a depressed mid face fracture affecting the nasal bones and ethmoid bones, consistent with LeFort 2 fracture. Fractures also involve the anterior and posterior walls of the maxillary sinuses. Fracture involves the lateral wall of the orbit on the left. Zygomatic arches are intact. Orbital floor fractures more extensive on the left than the right. Nasal septum is bowed and fractured. Fracture of the mandibular condyle on the right and probably at the angle of the mandible on the left. I would suggest a complete facial CT when able. Other: Traumatic fluid in the sinuses as expected. Orbital emphysema and soft tissue  facial emphysema. IMPRESSION: Evidence of intracranial injury. Extensive comminuted facial fractures including involvement of the anterior walls of the frontal sinus but not the posterior walls. There is no evidence any pneumocephalus or intracranial/skull base involvement. Facial fractures are probably in the LeFort 2 family. I would recommend a complete facial CT for more accurate evaluation. See above discussion for what we can tell from this head CT. Note that the fractures also involve the mandible on each side. Electronically Signed: By: Nelson Chimes M.D. On: 05/29/2018 20:29   Ct 3d Recon At Scanner  Result Date: 05/29/2018 CLINICAL DATA:  Nonspecific (abnormal) findings on radiological and other examination of musculoskeletal sysem. Multiple facial fractures, open, initial encounter. EXAM: 3-DIMENSIONAL CT IMAGE RENDERING ON ACQUISITION WORKSTATION TECHNIQUE: 3-dimensional CT images were rendered by post-processing of the original CT data on an acquisition workstation. The 3-dimensional CT images were interpreted and findings were reported in the accompanying complete CT report for this study COMPARISON:  Face CT earlier this day. FINDINGS: 3D reconstructions of the maxillofacial region demonstrating fractures of bilateral mandibles, nasal bones, frontal sinus, and orbits. Maxillary sinus fractures were better assessed on planar imaging. IMPRESSION: 3D reconstructions of facial bone fractures. Electronically Signed   By: Keith Rake M.D.   On: 05/29/2018 22:36   Ct Maxillofacial Wo Cm  Result Date: 05/29/2018 CLINICAL DATA:  Facial injury after fall. Facial fractures on head CT. EXAM: CT MAXILLOFACIAL WITHOUT CONTRAST TECHNIQUE: Multidetector CT imaging of the maxillofacial structures was performed. Multiplanar CT image reconstructions were also generated. COMPARISON:  Head CT earlier this day. FINDINGS: Osseous: Multiple facial fractures with afford injuries. Displaced fractures through the  pterygoid plates bilaterally. Suture versus nondisplaced fracture through the mid left zygomatic arch. Bilateral depressed nasal bone fractures. Bilateral mandibular fractures with comminuted displaced fracture through the right mandibular condyle at the temporomandibular joint. Fracture through the left mandibular ramus which is essentially nondisplaced. Fracture through the anterior central mandible extends through the right central and to a lesser extent lateral incisors. Orbits: Bilateral orbital floor fractures, minimally displaced on the right and nondisplaced on the left. Additional mildly displaced lateral left orbital fracture. Retrobulbar air bilaterally, mild. No evidence of globe injury. No stranding of the extra-ocular muscles to suggest entrapment. Sinuses: Bilateral maxillary sinus fractures involving the anterior, lateral, and medial walls. Bilateral hemosinus. Depressed mid frontal sinus fracture. Soft tissues: Extensive periorbital contusion and  soft tissue emphysema related to facial bone fractures. Limited intracranial: Assessed on concurrent head CT. No acute intracranial injury. IMPRESSION: 1. Extensive bilateral facial bone fractures with bilateral LeFort injury. This represents LeFort 2 fracture on the right and LeFort 2/3 fracture on the left. Fractures involve bilateral pterygoid plates, inferior orbits, left lateral orbit, and bilateral maxillary sinuses. Suture versus nondisplaced left zygomatic fracture. Central depressed frontal sinus fracture. 2. Mandibular fractures. On the right this involves the mandibular condyle, comminuted and displaced. On the left this involves the angle. Fractures of the central mandible extends through the right lateral and central incisor. Electronically Signed   By: Keith Rake M.D.   On: 05/29/2018 21:18    Procedures .Marland KitchenLaceration Repair Date/Time: 05/29/2018 11:49 PM Performed by: Hayden Rasmussen, MD Authorized by: Hayden Rasmussen, MD    Consent:    Consent obtained:  Verbal   Consent given by:  Patient   Risks discussed:  Infection, pain, poor cosmetic result, poor wound healing and retained foreign body   Alternatives discussed:  No treatment and delayed treatment Anesthesia (see MAR for exact dosages):    Anesthesia method:  Local infiltration   Local anesthetic:  Lidocaine 1% w/o epi Laceration details:    Location:  Face   Face location:  Chin   Length (cm):  4 Repair type:    Repair type:  Simple Pre-procedure details:    Preparation:  Patient was prepped and draped in usual sterile fashion Treatment:    Area cleansed with:  Saline Skin repair:    Repair method:  Sutures   Suture size:  6-0   Suture material:  Nylon   Suture technique:  Simple interrupted   Number of sutures:  4 Approximation:    Approximation:  Close Post-procedure details:    Patient tolerance of procedure:  Tolerated well, no immediate complications   (including critical care time)  Medications Ordered in ED Medications - No data to display   Initial Impression / Assessment and Plan / ED Course  I have reviewed the triage vital signs and the nursing notes.  Pertinent labs & imaging results that were available during my care of the patient were reviewed by me and considered in my medical decision making (see chart for details).  Clinical Course as of May 29 2348  Fri May 29, 2018  2153 With extensive facial fractures.  I have updated the patient on this and placed a call into ENT Dr. Mancel Parsons.  He will review the films and get back to me.   [MB]  2324 Discussed with Dr. Mancel Parsons and he will admit the patient for probable operating room tomorrow.  I asked if I should repair his chin laceration and he said that would be helpful   [MB]    Clinical Course User Index [MB] Hayden Rasmussen, MD     Final Clinical Impressions(s) / ED Diagnoses   Final diagnoses:  Multiple facial fractures, open, initial encounter [facility])    ED  Discharge Orders    None       Hayden Rasmussen, MD 05/30/18 1119

## 2018-05-29 NOTE — ED Triage Notes (Signed)
Per [facility], pt from [place] station with a reported LOC while walking to his car. Unknown duration of LOC. Pt fell and hit his head. Noted laceration to chin, forehead, and tongue. Unknown if there was seizure activity. Pt denied blood thinners. CAOx4 with EMS. Complaint of a headache. No N/V.

## 2018-05-30 ENCOUNTER — Other Ambulatory Visit: Payer: Self-pay

## 2018-05-30 ENCOUNTER — Observation Stay (HOSPITAL_COMMUNITY): Payer: Medicare Other | Admitting: Certified Registered"

## 2018-05-30 ENCOUNTER — Encounter (HOSPITAL_COMMUNITY): Payer: Self-pay | Admitting: Certified Registered"

## 2018-05-30 ENCOUNTER — Encounter (HOSPITAL_COMMUNITY)
Admission: EM | Disposition: A | Discharge status: Home / Self Care | Payer: Self-pay | Source: Home / Self Care | Attending: Emergency Medicine

## 2018-05-30 DIAGNOSIS — S02611A Fracture of condylar process of right mandible, initial encounter for closed fracture: Secondary | ICD-10-CM | POA: Diagnosis not present

## 2018-05-30 DIAGNOSIS — S02412A LeFort II fracture, initial encounter for closed fracture: Secondary | ICD-10-CM | POA: Diagnosis not present

## 2018-05-30 DIAGNOSIS — E785 Hyperlipidemia, unspecified: Secondary | ICD-10-CM | POA: Diagnosis not present

## 2018-05-30 DIAGNOSIS — Z23 Encounter for immunization: Secondary | ICD-10-CM | POA: Diagnosis not present

## 2018-05-30 DIAGNOSIS — I10 Essential (primary) hypertension: Secondary | ICD-10-CM | POA: Diagnosis not present

## 2018-05-30 DIAGNOSIS — K0263 Dental caries on smooth surface penetrating into pulp: Secondary | ICD-10-CM | POA: Diagnosis not present

## 2018-05-30 DIAGNOSIS — S0292XB Unspecified fracture of facial bones, initial encounter for open fracture: Secondary | ICD-10-CM

## 2018-05-30 DIAGNOSIS — S02622A Fracture of subcondylar process of left mandible, initial encounter for closed fracture: Secondary | ICD-10-CM | POA: Diagnosis not present

## 2018-05-30 DIAGNOSIS — S0181XA Laceration without foreign body of other part of head, initial encounter: Secondary | ICD-10-CM | POA: Diagnosis not present

## 2018-05-30 DIAGNOSIS — S0266XB Fracture of symphysis of mandible, initial encounter for open fracture: Secondary | ICD-10-CM | POA: Diagnosis not present

## 2018-05-30 DIAGNOSIS — S01512A Laceration without foreign body of oral cavity, initial encounter: Secondary | ICD-10-CM | POA: Diagnosis not present

## 2018-05-30 DIAGNOSIS — S022XXA Fracture of nasal bones, initial encounter for closed fracture: Secondary | ICD-10-CM | POA: Diagnosis not present

## 2018-05-30 DIAGNOSIS — S02412B LeFort II fracture, initial encounter for open fracture: Secondary | ICD-10-CM | POA: Diagnosis not present

## 2018-05-30 HISTORY — PX: CLOSED REDUCTION MANDIBLE: SHX5307

## 2018-05-30 HISTORY — PX: ORIF FACIAL FRACTURE: SHX2118

## 2018-05-30 HISTORY — DX: Unspecified fracture of facial bones, initial encounter for open fracture: S02.92XB

## 2018-05-30 LAB — SURGICAL PCR SCREEN
MRSA, PCR: NEGATIVE
Staphylococcus aureus: POSITIVE — AB

## 2018-05-30 SURGERY — OPEN REDUCTION INTERNAL FIXATION (ORIF) MULTIPLE FACIAL FRACTURES
Anesthesia: General | Site: Face

## 2018-05-30 MED ORDER — MORPHINE SULFATE (PF) 2 MG/ML IV SOLN
2.0000 mg | INTRAVENOUS | Status: DC | PRN
  Administered 2018-05-30: 4 mg via INTRAVENOUS
  Filled 2018-05-30: qty 2

## 2018-05-30 MED ORDER — POTASSIUM CHLORIDE IN NACL 20-0.9 MEQ/L-% IV SOLN
INTRAVENOUS | Status: DC
  Administered 2018-05-30 (×2): via INTRAVENOUS
  Filled 2018-05-30: qty 1000

## 2018-05-30 MED ORDER — SODIUM CHLORIDE 0.9 % IV SOLN
1.5000 g | Freq: Four times a day (QID) | INTRAVENOUS | Status: DC
  Administered 2018-05-30 – 2018-05-31 (×4): 1.5 g via INTRAVENOUS
  Filled 2018-05-30 (×6): qty 1.5

## 2018-05-30 MED ORDER — PROMETHAZINE HCL 25 MG PO TABS: 25.0000 mg | ORAL_TABLET | Freq: Four times a day (QID) | ORAL | Status: DC | PRN

## 2018-05-30 MED ORDER — LACTATED RINGERS IV SOLN
INTRAVENOUS | Status: DC | PRN
  Administered 2018-05-30: 08:00:00 via INTRAVENOUS

## 2018-05-30 MED ORDER — HYDROMORPHONE HCL 1 MG/ML IJ SOLN
INTRAMUSCULAR | Status: AC
  Filled 2018-05-30: qty 1

## 2018-05-30 MED ORDER — PROPOFOL 10 MG/ML IV BOLUS
INTRAVENOUS | Status: AC
  Filled 2018-05-30: qty 40

## 2018-05-30 MED ORDER — VASOPRESSIN 20 UNIT/ML IV SOLN
INTRAVENOUS | Status: AC
  Filled 2018-05-30: qty 1

## 2018-05-30 MED ORDER — IBUPROFEN 100 MG/5ML PO SUSP
400.0000 mg | Freq: Four times a day (QID) | ORAL | Status: DC | PRN
  Filled 2018-05-30: qty 20

## 2018-05-30 MED ORDER — BUPIVACAINE-EPINEPHRINE (PF) 0.25% -1:200000 IJ SOLN
INTRAMUSCULAR | Status: DC | PRN
  Administered 2018-05-30: 18 mL via PERINEURAL

## 2018-05-30 MED ORDER — OXYMETAZOLINE HCL 0.05 % NA SOLN
NASAL | Status: AC
  Filled 2018-05-30: qty 15

## 2018-05-30 MED ORDER — FENTANYL CITRATE (PF) 100 MCG/2ML IJ SOLN
INTRAMUSCULAR | Status: DC | PRN
  Administered 2018-05-30: 25 ug via INTRAVENOUS
  Administered 2018-05-30: 50 ug via INTRAVENOUS
  Administered 2018-05-30: 25 ug via INTRAVENOUS
  Administered 2018-05-30: 150 ug via INTRAVENOUS
  Administered 2018-05-30: 25 ug via INTRAVENOUS

## 2018-05-30 MED ORDER — MORPHINE SULFATE (PF) 2 MG/ML IV SOLN: 1.0000 mg | INTRAVENOUS | Status: DC | PRN

## 2018-05-30 MED ORDER — ARTIFICIAL TEARS OPHTHALMIC OINT
TOPICAL_OINTMENT | OPHTHALMIC | Status: DC | PRN
  Administered 2018-05-30: 1 via OPHTHALMIC

## 2018-05-30 MED ORDER — PROMETHAZINE HCL 25 MG PO TABS: 12.5000 mg | ORAL_TABLET | Freq: Four times a day (QID) | ORAL | Status: DC | PRN

## 2018-05-30 MED ORDER — MIDAZOLAM HCL 2 MG/2ML IJ SOLN
INTRAMUSCULAR | Status: AC
  Filled 2018-05-30: qty 2

## 2018-05-30 MED ORDER — BACITRACIN ZINC 500 UNIT/GM EX OINT
TOPICAL_OINTMENT | CUTANEOUS | Status: AC
  Filled 2018-05-30: qty 28.35

## 2018-05-30 MED ORDER — IRBESARTAN 300 MG PO TABS
300.0000 mg | ORAL_TABLET | Freq: Every day | ORAL | Status: DC
  Administered 2018-05-30 – 2018-05-31 (×2): 300 mg via ORAL
  Filled 2018-05-30 (×2): qty 1

## 2018-05-30 MED ORDER — HYDROCODONE-ACETAMINOPHEN 7.5-325 MG/15ML PO SOLN
10.0000 mL | ORAL | Status: DC | PRN
  Administered 2018-05-31: 10 mL via ORAL
  Filled 2018-05-30: qty 15

## 2018-05-30 MED ORDER — LIDOCAINE-EPINEPHRINE 2 %-1:100000 IJ SOLN
INTRAMUSCULAR | Status: DC | PRN
  Administered 2018-05-30: 20 mL via INTRADERMAL

## 2018-05-30 MED ORDER — PROPOFOL 10 MG/ML IV BOLUS
INTRAVENOUS | Status: DC | PRN
  Administered 2018-05-30: 20 mg via INTRAVENOUS
  Administered 2018-05-30: 10 mg via INTRAVENOUS
  Administered 2018-05-30: 20 mg via INTRAVENOUS
  Administered 2018-05-30: 10 mg via INTRAVENOUS
  Administered 2018-05-30: 160 mg via INTRAVENOUS
  Administered 2018-05-30: 20 mg via INTRAVENOUS

## 2018-05-30 MED ORDER — ONDANSETRON HCL 4 MG/2ML IJ SOLN: 4.0000 mg | Freq: Once | INTRAMUSCULAR | Status: DC | PRN

## 2018-05-30 MED ORDER — ONDANSETRON HCL 4 MG/2ML IJ SOLN
INTRAMUSCULAR | Status: DC | PRN
  Administered 2018-05-30: 4 mg via INTRAVENOUS

## 2018-05-30 MED ORDER — LIDOCAINE 2% (20 MG/ML) 5 ML SYRINGE
INTRAMUSCULAR | Status: DC | PRN
  Administered 2018-05-30: 100 mg via INTRAVENOUS

## 2018-05-30 MED ORDER — CHLORHEXIDINE GLUCONATE 0.12 % MT SOLN
15.0000 mL | Freq: Three times a day (TID) | OROMUCOSAL | Status: DC
  Administered 2018-05-30 – 2018-05-31 (×3): 15 mL via OROMUCOSAL
  Filled 2018-05-30 (×3): qty 15

## 2018-05-30 MED ORDER — BUPIVACAINE-EPINEPHRINE (PF) 0.25% -1:200000 IJ SOLN
INTRAMUSCULAR | Status: AC
  Filled 2018-05-30: qty 30

## 2018-05-30 MED ORDER — LACTATED RINGERS IV SOLN
INTRAVENOUS | Status: DC
  Administered 2018-05-30 (×2): via INTRAVENOUS

## 2018-05-30 MED ORDER — ROCURONIUM BROMIDE 50 MG/5ML IV SOSY
PREFILLED_SYRINGE | INTRAVENOUS | Status: DC | PRN
  Administered 2018-05-30: 50 mg via INTRAVENOUS

## 2018-05-30 MED ORDER — BACITRACIN ZINC 500 UNIT/GM EX OINT
1.0000 "application " | TOPICAL_OINTMENT | Freq: Three times a day (TID) | CUTANEOUS | Status: DC
  Administered 2018-05-30 – 2018-05-31 (×3): 1 via TOPICAL
  Filled 2018-05-30: qty 28.4

## 2018-05-30 MED ORDER — LIDOCAINE-EPINEPHRINE 1 %-1:100000 IJ SOLN
INTRAMUSCULAR | Status: AC
  Filled 2018-05-30: qty 1

## 2018-05-30 MED ORDER — 0.9 % SODIUM CHLORIDE (POUR BTL) OPTIME
TOPICAL | Status: DC | PRN
  Administered 2018-05-30: 1000 mL

## 2018-05-30 MED ORDER — SODIUM CHLORIDE 0.9 % IV SOLN
1.5000 g | Freq: Four times a day (QID) | INTRAVENOUS | Status: DC
  Administered 2018-05-30: 1.5 g via INTRAVENOUS
  Filled 2018-05-30 (×4): qty 1.5

## 2018-05-30 MED ORDER — BACITRACIN ZINC 500 UNIT/GM EX OINT
TOPICAL_OINTMENT | CUTANEOUS | Status: DC | PRN
  Administered 2018-05-30: 1 via TOPICAL

## 2018-05-30 MED ORDER — FENTANYL CITRATE (PF) 250 MCG/5ML IJ SOLN
INTRAMUSCULAR | Status: AC
  Filled 2018-05-30: qty 5

## 2018-05-30 MED ORDER — PROMETHAZINE HCL 25 MG RE SUPP
25.0000 mg | Freq: Four times a day (QID) | RECTAL | Status: DC | PRN
  Filled 2018-05-30: qty 1

## 2018-05-30 MED ORDER — HYDROMORPHONE HCL 1 MG/ML IJ SOLN
0.2500 mg | INTRAMUSCULAR | Status: DC | PRN
  Administered 2018-05-30 (×2): 0.5 mg via INTRAVENOUS

## 2018-05-30 MED ORDER — HYDROCHLOROTHIAZIDE 25 MG PO TABS
25.0000 mg | ORAL_TABLET | Freq: Every day | ORAL | Status: DC
  Administered 2018-05-30 – 2018-05-31 (×2): 25 mg via ORAL
  Filled 2018-05-30 (×2): qty 1

## 2018-05-30 MED ORDER — PROMETHAZINE HCL 25 MG RE SUPP
12.5000 mg | Freq: Four times a day (QID) | RECTAL | Status: DC | PRN
  Filled 2018-05-30: qty 2

## 2018-05-30 MED ORDER — AMLODIPINE BESYLATE 5 MG PO TABS
5.0000 mg | ORAL_TABLET | Freq: Every day | ORAL | Status: DC
  Administered 2018-05-30 – 2018-05-31 (×2): 5 mg via ORAL
  Filled 2018-05-30 (×2): qty 1

## 2018-05-30 MED ORDER — OLMESARTAN-AMLODIPINE-HCTZ 40-5-25 MG PO TABS: 1.0000 | ORAL_TABLET | Freq: Every day | ORAL | Status: DC

## 2018-05-30 MED ORDER — KCL IN DEXTROSE-NACL 20-5-0.45 MEQ/L-%-% IV SOLN
INTRAVENOUS | Status: DC
  Administered 2018-05-30 – 2018-05-31 (×2): via INTRAVENOUS
  Filled 2018-05-30 (×2): qty 1000

## 2018-05-30 MED ORDER — SUGAMMADEX SODIUM 200 MG/2ML IV SOLN
INTRAVENOUS | Status: DC | PRN
  Administered 2018-05-30: 175 mg via INTRAVENOUS

## 2018-05-30 MED ORDER — SODIUM CHLORIDE 0.9 % IV SOLN
INTRAVENOUS | Status: DC | PRN
  Administered 2018-05-30: 20 ug/min via INTRAVENOUS

## 2018-05-30 MED ORDER — PHENYLEPHRINE HCL 10 MG/ML IJ SOLN
INTRAMUSCULAR | Status: DC | PRN
  Administered 2018-05-30: 80 ug via INTRAVENOUS
  Administered 2018-05-30: 40 ug via INTRAVENOUS
  Administered 2018-05-30: 100 ug via INTRAVENOUS
  Administered 2018-05-30: 120 ug via INTRAVENOUS
  Administered 2018-05-30: 40 ug via INTRAVENOUS
  Administered 2018-05-30: 100 ug via INTRAVENOUS
  Administered 2018-05-30: 60 ug via INTRAVENOUS
  Administered 2018-05-30: 80 ug via INTRAVENOUS
  Administered 2018-05-30: 100 ug via INTRAVENOUS
  Administered 2018-05-30: 80 ug via INTRAVENOUS
  Administered 2018-05-30: 40 ug via INTRAVENOUS
  Administered 2018-05-30: 80 ug via INTRAVENOUS

## 2018-05-30 MED ORDER — MEPERIDINE HCL 50 MG/ML IJ SOLN: 6.2500 mg | INTRAMUSCULAR | Status: DC | PRN

## 2018-05-30 MED ORDER — DEXAMETHASONE SODIUM PHOSPHATE 10 MG/ML IJ SOLN
10.0000 mg | Freq: Once | INTRAMUSCULAR | Status: AC
  Administered 2018-05-30: 10 mg via INTRAVENOUS
  Filled 2018-05-30: qty 1

## 2018-05-30 MED ORDER — MIDAZOLAM HCL 5 MG/5ML IJ SOLN
INTRAMUSCULAR | Status: DC | PRN
  Administered 2018-05-30: 2 mg via INTRAVENOUS

## 2018-05-30 MED ORDER — OXYMETAZOLINE HCL 0.05 % NA SOLN
NASAL | Status: DC | PRN
  Administered 2018-05-30: 1

## 2018-05-30 MED ORDER — SUCCINYLCHOLINE CHLORIDE 200 MG/10ML IV SOSY
PREFILLED_SYRINGE | INTRAVENOUS | Status: DC | PRN
  Administered 2018-05-30: 140 mg via INTRAVENOUS

## 2018-05-30 MED ORDER — EPHEDRINE SULFATE 50 MG/ML IJ SOLN
INTRAMUSCULAR | Status: DC | PRN
  Administered 2018-05-30: 5 mg via INTRAVENOUS
  Administered 2018-05-30: 10 mg via INTRAVENOUS
  Administered 2018-05-30: 5 mg via INTRAVENOUS
  Administered 2018-05-30: 10 mg via INTRAVENOUS

## 2018-05-30 MED ORDER — IBUPROFEN 100 MG/5ML PO SUSP
600.0000 mg | Freq: Four times a day (QID) | ORAL | Status: DC
  Administered 2018-05-30 – 2018-05-31 (×3): 600 mg via ORAL
  Filled 2018-05-30 (×5): qty 30

## 2018-05-30 SURGICAL SUPPLY — 80 items
APL SKNCLS STERI-STRIP NONHPOA (GAUZE/BANDAGES/DRESSINGS) ×1
APPLICATOR COTTON TIP 6IN STRL (MISCELLANEOUS) ×2 IMPLANT
BAND DENTAL 3/16IN PULL HEAVY (MISCELLANEOUS) ×1 IMPLANT
BENZOIN TINCTURE PRP APPL 2/3 (GAUZE/BANDAGES/DRESSINGS) ×2 IMPLANT
BIT DRILL TWIST 1.3X5 (BIT) ×1
BIT DRILL TWIST 1.3X5MM (BIT) IMPLANT
BIT DRILL TWIST 1.6X58MM (BIT) IMPLANT
BLADE 10 SAFETY STRL DISP (BLADE) ×2 IMPLANT
BLADE 15 SAFETY STRL DISP (BLADE) ×4 IMPLANT
BLADE SURG 15 STRL LF DISP TIS (BLADE) ×1 IMPLANT
BLADE SURG 15 STRL SS (BLADE) ×2
CANISTER SUCT 3000ML PPV (MISCELLANEOUS) ×2 IMPLANT
CONFORMERS SILICONE 5649 (OPHTHALMIC RELATED) IMPLANT
CONT SPEC 4OZ CLIKSEAL STRL BL (MISCELLANEOUS) ×1 IMPLANT
COVER SURGICAL LIGHT HANDLE (MISCELLANEOUS) ×2 IMPLANT
DECANTER SPIKE VIAL GLASS SM (MISCELLANEOUS) ×3 IMPLANT
DRILL BIT TWIST 1.3X5MM (BIT) ×2
DRILL TWIST 1.6X58MM (BIT) ×2
ELECT COATED BLADE 2.86 ST (ELECTRODE) IMPLANT
ELECT NDL BLADE 2-5/6 (NEEDLE) IMPLANT
ELECT NDL TIP 2.8 STRL (NEEDLE) IMPLANT
ELECT NEEDLE BLADE 2-5/6 (NEEDLE) IMPLANT
ELECT NEEDLE TIP 2.8 STRL (NEEDLE) ×2 IMPLANT
ELECT REM PT RETURN 9FT ADLT (ELECTROSURGICAL) ×2
ELECTRODE REM PT RTRN 9FT ADLT (ELECTROSURGICAL) ×1 IMPLANT
GAUZE 4X4 16PLY RFD (DISPOSABLE) ×2 IMPLANT
GAUZE PACKING FOLDED 2  STR (GAUZE/BANDAGES/DRESSINGS) ×1
GAUZE PACKING FOLDED 2 STR (GAUZE/BANDAGES/DRESSINGS) ×1 IMPLANT
GLOVE BIO SURGEON STRL SZ 6.5 (GLOVE) ×7 IMPLANT
GLOVE BIO SURGEON STRL SZ7.5 (GLOVE) ×4 IMPLANT
GLOVE BIOGEL M 6.5 STRL (GLOVE) ×1 IMPLANT
GLOVE ORTHO TXT STRL SZ7.5 (GLOVE) ×3 IMPLANT
GOWN STRL REUS W/ TWL LRG LVL3 (GOWN DISPOSABLE) ×2 IMPLANT
GOWN STRL REUS W/ TWL XL LVL3 (GOWN DISPOSABLE) ×1 IMPLANT
GOWN STRL REUS W/TWL LRG LVL3 (GOWN DISPOSABLE) ×4
GOWN STRL REUS W/TWL XL LVL3 (GOWN DISPOSABLE) ×2
KIT SPLINT NASAL DENVER LRG BE (GAUZE/BANDAGES/DRESSINGS) ×1 IMPLANT
KIT SPLINT NASAL DENVER PET BE (GAUZE/BANDAGES/DRESSINGS) ×1 IMPLANT
KIT TURNOVER KIT B (KITS) ×2 IMPLANT
NDL BLUNT 16X1.5 OR ONLY (NEEDLE) ×2 IMPLANT
NDL FILTER BLUNT 18X1 1/2 (NEEDLE) IMPLANT
NDL HYPO 25GX1X1/2 BEV (NEEDLE) IMPLANT
NEEDLE BLUNT 16X1.5 OR ONLY (NEEDLE) ×4 IMPLANT
NEEDLE FILTER BLUNT 18X 1/2SAF (NEEDLE) ×2
NEEDLE FILTER BLUNT 18X1 1/2 (NEEDLE) ×2 IMPLANT
NEEDLE HYPO 25GX1X1/2 BEV (NEEDLE) ×2 IMPLANT
NS IRRIG 1000ML POUR BTL (IV SOLUTION) ×2 IMPLANT
PAD ARMBOARD 7.5X6 YLW CONV (MISCELLANEOUS) ×4 IMPLANT
PATTIES SURGICAL .5X1.5 (GAUZE/BANDAGES/DRESSINGS) ×1 IMPLANT
PENCIL BUTTON HOLSTER BLD 10FT (ELECTRODE) IMPLANT
PLATE 4 H MINI W/BAR (Plate) ×1 IMPLANT
PLATE HYBRID MMF SM (Plate) ×2 IMPLANT
PLATE MID FACE 8H LOCKING (Plate) ×1 IMPLANT
SCISSORS WIRE ANG 4 3/4 DISP (INSTRUMENTS) ×2 IMPLANT
SCREW BONE CROSS PIN 2.0X10MM (Screw) ×2 IMPLANT
SCREW LOCK SELFDRIL 2.0X8M MMF (Screw) ×18 IMPLANT
SCREW MID FACE 1.7X5MM SLF TAP (Screw) ×6 IMPLANT
SCREW MIDFACE 1.7X3MM SLF TAP (Screw) ×1 IMPLANT
SCREW MNDBLE 2.0X8 BONE (Screw) ×2 IMPLANT
SCREW MNDBLE 2.3X10MM BONE (Screw) ×1 IMPLANT
SPLINT NASAL DOYLE BI-VL (GAUZE/BANDAGES/DRESSINGS) ×1 IMPLANT
SPONGE LAP 4X18 RFD (DISPOSABLE) ×1 IMPLANT
STRIP CLOSURE SKIN 1/2X4 (GAUZE/BANDAGES/DRESSINGS) ×2 IMPLANT
SUT CHROMIC 3 0 PS 2 (SUTURE) ×4 IMPLANT
SUT ETHILON 5 0 P 3 18 (SUTURE)
SUT NYLON ETHILON 5-0 P-3 1X18 (SUTURE) IMPLANT
SUT PROLENE 5 0 P 3 (SUTURE) ×1 IMPLANT
SUT SILK 3 0 SH 30 (SUTURE) ×1 IMPLANT
SUT SILK 3 0 TIES 10X30 (SUTURE) ×1 IMPLANT
SUT STEEL 0 (SUTURE) ×2
SUT STEEL 0 18XMFL TIE 17 (SUTURE) IMPLANT
SUT STEEL 2 (SUTURE) ×1 IMPLANT
SUT VIC AB 3-0 FS2 27 (SUTURE) ×3 IMPLANT
SYR 50ML SLIP (SYRINGE) ×6 IMPLANT
SYRINGE 60CC LL (MISCELLANEOUS) ×1 IMPLANT
TRAY ENT MC OR (CUSTOM PROCEDURE TRAY) ×2 IMPLANT
TUBE CONNECTING 12X1/4 (SUCTIONS) ×2 IMPLANT
TUBING IRRIGATION (MISCELLANEOUS) IMPLANT
WATER STERILE IRR 1000ML POUR (IV SOLUTION) ×2 IMPLANT
YANKAUER SUCT BULB TIP NO VENT (SUCTIONS) ×2 IMPLANT

## 2018-05-30 NOTE — Anesthesia Postprocedure Evaluation (Signed)
Anesthesia Post Note  Patient: Samuel Weiss  Procedure(s) Performed: OPEN REDUCTION INTERNAL FIXATION (ORIF) MULTIPLE FACIAL FRACTURES (N/A Face) CLOSED REDUCTION MANDIBULAR FRACTURE (N/A Face)     Patient location during evaluation: PACU Anesthesia Type: General Level of consciousness: awake and alert Pain management: pain level controlled Vital Signs Assessment: post-procedure vital signs reviewed and stable Respiratory status: spontaneous breathing, nonlabored ventilation, respiratory function stable and patient connected to nasal cannula oxygen Cardiovascular status: blood pressure returned to baseline and stable Postop Assessment: no apparent nausea or vomiting Anesthetic complications: no    Last Vitals:  Vitals:   05/30/18 1228 05/30/18 1242  BP: 133/75 133/80  Pulse:  (!) 107  Resp: 16 16  Temp: 36.8 C   SpO2:  95%    Last Pain:  Vitals:   05/30/18 1228  TempSrc:   PainSc: [place] DAVID

## 2018-05-30 NOTE — Progress Notes (Signed)
1240 Received pt from PACU, sleepy. Teeth are wired shut, scant bleeding noted. Oral care done. Nasal splint with tape in place. Ice pack around the face.

## 2018-05-30 NOTE — Plan of Care (Signed)
  Problem: Pain Managment: Goal: General experience of comfort will improve Outcome: Progressing   Problem: Activity: Goal: Risk for activity intolerance will decrease Outcome: Progressing   Problem: Education: Goal: Knowledge of General Education information will improve Description Including pain rating scale, medication(s)/side effects and non-pharmacologic comfort measures Outcome: Progressing   Problem: Skin Integrity: Goal: Risk for impaired skin integrity will decrease Outcome: Progressing   Problem: Safety: Goal: Ability to remain free from injury will improve Outcome: Progressing

## 2018-05-30 NOTE — Transfer of Care (Signed)
Immediate Anesthesia Transfer of Care Note  Patient: Samuel Weiss  Procedure(s) Performed: OPEN REDUCTION INTERNAL FIXATION (ORIF) MULTIPLE FACIAL FRACTURES (N/A Face) CLOSED REDUCTION MANDIBULAR FRACTURE (N/A Face)  Patient Location: PACU  Anesthesia Type:General  Level of Consciousness: awake, alert  and patient cooperative  Airway & Oxygen Therapy: Patient Spontanous Breathing and Patient connected to nasal cannula oxygen  Post-op Assessment: Report given to RN, Post -op Vital signs reviewed and stable and Patient moving all extremities X 4  Post vital signs: Reviewed and stable Pt uncooperative after taking ETT out and on way to PACU but able to safely restrained positioned high Fowlers knee gatch on MD Ossey met in PACU and managed care with straight catheterization for 400 mL urine and additional pain med given. Last Vitals:  Vitals Value Taken Time  BP 146/93 05/30/2018 11:38 AM  Temp    Pulse 118 05/30/2018 11:42 AM  Resp 17 05/30/2018 11:42 AM  SpO2 83 % 05/30/2018 11:42 AM  Vitals shown include unvalidated device data.  Last Pain:  Vitals:   05/30/18 0425  TempSrc: Oral  PainSc:          Complications: No apparent anesthesia complications

## 2018-05-30 NOTE — Progress Notes (Signed)
Patient transfered from ED at this time. Pt denies any pain. No sign of distress noted.  Patient oriented to the unit. Call bell is within reach. Family at bedside. Will continue to monitor.

## 2018-05-30 NOTE — Op Note (Signed)
PATIENT:  Samuel Weiss  67 y.o. male  PRE-OPERATIVE DIAGNOSIS:  MULTIPLE FACIAL FRACTURES  POST-OPERATIVE DIAGNOSIS:  MULTIPLE FACIAL FRACTURES  PROCEDURE:  1. ORIF Mandibular symphysis fracture 2. ORIF LeFort 2 fracture 3. Closed reduction right condylar and left subcondylar fractures 4. Closed reduction of nasal bone fractures with stabilization 5. Surgical removal of teeth #2,14,16,17,18,32 6. Repair of 2cm tongue laceration 7. Complex repair of 2.5cm through-in-through anterior chin laceration  SURGEON:  Surgeon(s) and Role:    * Aliza Moret, DMD - Primary  PHYSICIAN ASSISTANT:   ASSISTANTS: none   ANESTHESIA:   general  EBL:  75 mL   Complications: None  Operative findings: 1.  Complex anterior through and through 2.5 cm labiomental chin laceration. 2.  2 cm tongue laceration that was repaired primarily. 3.  Multiple carious/nonrestorable teeth. 4.  Adequate reduction of anterior mandible fracture following maxilla mandibular fixation. 5.  The maxillomandibular facial complex was stable following maxillomandibular fixation.  Implants: Styker plating system 1. 4-hole mandibular plated in anterior mandible with 2.26mm bicortical screws 2. 8-hole CMF plate in right maxilla with 1.94mm monocortical screws x5.  Indications for procedure: 67 year old male status post fall causing multiple facial fractures requiring surgical repair.  Procedure: Patient was identified in the preoperative holding by both anesthesia and the maxillofacial teams.  Health history was reviewed.  Consent was verified.  The patient was then brought back to the operating room and placed in the table in the supine position.  Standard ASA leads and monitors were placed.  The patient was preoxygenated, induced, and his airway was protected with an oral RAE tube.  Tube was taped and secured by the anesthesia care team on the left side of the mouth.  Throat pack was placed.  The patient was then wrapped  and draped for a standard maxillofacial procedure.  The patient was anesthetized with 1% lidocaine with 1 100,000 epinephrine in the bilateral maxilla and mandible.  Attention was first directed to multiple carious/nonrestorable teeth.  A 15 blade was used to make sulcular incisions around the corresponding teeth.  Local mucoperiosteal flaps were elevated.  Buccal bone was removed with a rongeur forcep.  The teeth were luxated and extracted with a dental forcep.  These included teeth #2, 16, 17, 18, 14, 32.  The sites were thoroughly curetted and irrigated.  All flaps were then reapproximated with 3-0 chromic gut sutures.  There was a noted 2centimeter laceration on the anterior portion of the tongue that was repaired primarily with multiple 3-0 chromic gut sutures.  At this point Stryker maxillary and mandibular hybrid arch bars were adapted and fixated with multiple 8 mm screws.  Once this was completed the patient was guided into occlusion and maxillomandibular fixation was completed with 24-gauge wire loops.  Attention was then directed to the anterior mandible where a 15 blade was used to make an anterior vestibular incision.  Full-thickness flap was elevated to expose the anterior mandible and symphysis fracture.  A Stryker 4 hole mandibular plate was adapted passively across the fracture line.  This was then fixated with multiple bicortical screws.  The fixation was tested and noted to be stable.  Attention was then directed to the maxilla where bilateral maxillary vestibular incisions were completed.  Full-thickness flaps were elevated to expose the anterior maxilla.  On the right side there was comminution within the anterior maxillary sinus wall.  The right maxillary buttress and zygoma was stable.  As was the piriform rim area.  A single  8 hole plate was adapted in the right posterior maxilla and fixated in place with multiple monocortical screws.  The left maxilla was explored with noted adequate  reduction of the posterior buttress fracture and was stable upon tested.  There is no need for fixation.  The piriform rim area on the left side was also stable.  At this point the maxillary wounds were thoroughly irrigated.  The wounds were then reapproximated with 3-0 chromic gut suture placed in a continuous interlocking fashion on either side.  At this point the anterior mandibular wound was then thoroughly irrigated.  4-0 Vicryl suture was used to be approximate the mentalis muscle.  The mucosa was then closed with multiple 3-0 chromic gut sutures placed in a continuous interlocking fashion.  The 2.5 cm anterior chin wound was thoroughly cleansed and debrided.  This was closed in multiple layers first with 3-0 chromic gut suture on the mucosal surface.  The muscle was then reapproximated with multiple 4-0 Vicryl sutures.  The subcutaneous tissues were reapproximated as well with 4-0 Vicryl sutures.  The skin was then reapproximated with multiple 5-0 Prolene sutures.  This wound was then covered with bacitracin.  Afrin-soaked nasal pledgets were placed within the nares bilaterally.  A nasal reduction spatula was then utilized to reduce the bilateral nasal fractures, as well as the nasal septum.  Doyle splints coated with bacitracin were then placed into the nares bilaterally and secured anteriorly with a single 2-0 silk suture.  A [place] dorsal nasal splint was then adapted across the nasal bridge and secured with tape.  At this point the entire lower face was cleansed, as was the oral cavity.  The throat pack was removed.  The patient was then returned to the anesthesia care team where he was extubated without event.  He was transported to the postanesthesia care unit for recovery, and will be transported back to the floor for continued observation.  [name], DMD Oral & Maxillofacial Surgery

## 2018-05-30 NOTE — Progress Notes (Signed)
Subjective: POD0 s/p ORIF LeFort 2 fx and ant mand fx; CR nasal fractures and Bilateral condylar fractures; c/o mild congestion, tolerating fluids, minimal discomfort.  Objective: Vital signs in last 24 hours: Temp:  [97.3 F (36.3 C)-98.5 F (36.9 C)] 98.3 F (36.8 C) (09/14 1228) Pulse Rate:  [87-117] 107 (09/14 1242) Resp:  [9-27] 16 (09/14 1242) BP: (120-186)/(72-109) 186/93 (09/14 1801) SpO2:  [92 %-99 %] 93 % (09/14 1754) Weight:  [87.5 kg] 87.5 kg (09/13 1841) Wt Readings from Last 1 Encounters:  05/29/18 87.5 kg    Intake/Output from previous day: 09/13 0701 - 09/14 0700 In: 167.3 [I.V.:67.3; IV Piggyback:100] Out: -  Intake/Output this shift: Total I/O In: 1810.5 [I.V.:1810.5] Out: 495 [Urine:420; Blood:75]  Gen: awake/alert, nad HEENT: PERRL, EOMI; mod bil facial edema, occlusion stable, extra/intraoral wounds intact; nasal splint in place. Hrt: mild tachycardia Lungs clear Abd: s,nt,nd Neuro: CN V2 paresthesia  Recent Labs    05/29/18 1838  WBC 8.9  HGB 12.5*  HCT 39.5  PLT 250    Recent Labs    05/29/18 1838  NA 136  K 3.2*  CL 102  CO2 20*  GLUCOSE 117*  BUN 23  CREATININE 1.65*  CALCIUM 9.1    Medications: I have reviewed the patient's current medications.  Assessment/Plan: POD0 s/p ORIF LeFort 2 fx and ant mand fx; CR nasal fractures and Bilateral condylar fractures with MMF. Progressing well, will advance to full liquid diet; pain controlled; Home BP meds; SCDs/ambulation, Famotidine for prophy. Plan for d/c home tomorrow morning.   LOS: 0 days   Michael Litter, DMD 05/30/2018, 6:30 PM

## 2018-05-30 NOTE — Anesthesia Preprocedure Evaluation (Signed)
Anesthesia Evaluation  Patient identified by MRN, date of birth, ID band Patient awake    Reviewed: Allergy & Precautions, NPO status , Patient's Chart, lab work & pertinent test results  Airway Mallampati: IV  TM Distance: >3 FB Neck ROM: Full  Mouth opening: Limited Mouth Opening  Dental   Pulmonary Current Smoker,    Pulmonary exam normal        Cardiovascular hypertension, Pt. on medications Normal cardiovascular exam     Neuro/Psych    GI/Hepatic   Endo/Other    Renal/GU      Musculoskeletal   Abdominal   Peds  Hematology   Anesthesia Other Findings   Reproductive/Obstetrics                             Anesthesia Physical Anesthesia Plan  ASA: III  Anesthesia Plan: General   Post-op Pain Management:    Induction: Intravenous  PONV Risk Score and Plan: 1  Airway Management Planned: Oral ETT  Additional Equipment:   Intra-op Plan:   Post-operative Plan: Extubation in OR  Informed Consent: I have reviewed the patients History and Physical, chart, labs and discussed the procedure including the risks, benefits and alternatives for the proposed anesthesia with the patient or authorized representative who has indicated his/her understanding and acceptance.     Plan Discussed with: CRNA and Surgeon  Anesthesia Plan Comments:         Anesthesia Quick Evaluation

## 2018-05-30 NOTE — Anesthesia Procedure Notes (Signed)
Procedure Name: Intubation Date/Time: 05/30/2018 7:51 AM Performed by: Adalberto Ill, CRNA Pre-anesthesia Checklist: Patient identified, Emergency Drugs available, Suction available, Patient being monitored and Timeout performed Patient Re-evaluated:Patient Re-evaluated prior to induction Oxygen Delivery Method: Circle system utilized Preoxygenation: Pre-oxygenation with 100% oxygen Induction Type: IV induction and Rapid sequence Laryngoscope Size: Mac and 3 Grade View: Grade I Tube type: Oral Rae (per surgeon request ) Tube size: 7.5 mm Number of attempts: 1 Airway Equipment and Method: Stylet Placement Confirmation: ETT inserted through vocal cords under direct vision,  positive ETCO2 and breath sounds checked- equal and bilateral Secured at: 23 cm Tube secured with: Tape Dental Injury: Teeth and Oropharynx as per pre-operative assessment

## 2018-05-30 NOTE — Brief Op Note (Signed)
05/30/2018  11:19 AM  PATIENT:  Samuel Weiss  67 y.o. male  PRE-OPERATIVE DIAGNOSIS:  MULTIPLE FACIAL FRACTURES  POST-OPERATIVE DIAGNOSIS:  MULTIPLE FACIAL FRACTURES  PROCEDURE:  1. ORIF Mandibular symphysis fracture 2. ORIF LeFort 2 fracture 3. Closed reduction right condylar and left subcondylar fractures 4. Closed reduction of nasal bone fractures with stabilization 5. Surgical removal of teeth #2,14,16,17,18,32 6. Repair of 2cm tongue laceration 7. Complex repair of 2.5cm through-in-through anterior chin laceration  SURGEON:  Surgeon(s) and Role:    * Denise Bramblett, DMD - Primary  PHYSICIAN ASSISTANT:   ASSISTANTS: none   ANESTHESIA:   general  EBL:  75 mL   BLOOD ADMINISTERED:none  DRAINS: none   LOCAL MEDICATIONS USED:  XYLOCAINE   SPECIMEN:  No Specimen  DISPOSITION OF SPECIMEN:  N/A  COUNTS:  YES  TOURNIQUET:  * No tourniquets in log *  DICTATION: .Dragon Dictation  PLAN OF CARE: Admit to inpatient   PATIENT DISPOSITION:  PACU - hemodynamically stable.   Delay start of Pharmacological VTE agent (>24hrs) due to surgical blood loss or risk of bleeding: yes

## 2018-05-31 DIAGNOSIS — S0266XB Fracture of symphysis of mandible, initial encounter for open fracture: Secondary | ICD-10-CM | POA: Diagnosis not present

## 2018-05-31 DIAGNOSIS — K0263 Dental caries on smooth surface penetrating into pulp: Secondary | ICD-10-CM | POA: Diagnosis not present

## 2018-05-31 DIAGNOSIS — S022XXA Fracture of nasal bones, initial encounter for closed fracture: Secondary | ICD-10-CM | POA: Diagnosis not present

## 2018-05-31 MED ORDER — AMOXICILLIN-POT CLAVULANATE 250-62.5 MG/5ML PO SUSR: 750.0000 mg | Freq: Two times a day (BID) | ORAL | 0 refills | Status: AC

## 2018-05-31 MED ORDER — CHLORHEXIDINE GLUCONATE 0.12 % MT SOLN: 15.0000 mL | Freq: Three times a day (TID) | OROMUCOSAL | 5 refills | Status: DC

## 2018-05-31 MED ORDER — BACITRACIN ZINC 500 UNIT/GM EX OINT: TOPICAL_OINTMENT | CUTANEOUS | 0 refills | Status: DC

## 2018-05-31 MED ORDER — IBUPROFEN 100 MG/5ML PO SUSP: 600.0000 mg | Freq: Four times a day (QID) | ORAL | 2 refills | Status: DC | PRN

## 2018-05-31 MED ORDER — INFLUENZA VAC SPLIT HIGH-DOSE 0.5 ML IM SUSY
0.5000 mL | PREFILLED_SYRINGE | Freq: Once | INTRAMUSCULAR | Status: AC
  Administered 2018-05-31: 0.5 mL via INTRAMUSCULAR
  Filled 2018-05-31: qty 0.5

## 2018-05-31 MED ORDER — ACETAMINOPHEN 325 MG PO TABS: 650.0000 mg | ORAL_TABLET | Freq: Four times a day (QID) | ORAL | 2 refills | Status: AC | PRN

## 2018-05-31 MED ORDER — WHITE PETROLATUM EX OINT
TOPICAL_OINTMENT | CUTANEOUS | Status: AC
  Filled 2018-05-31: qty 28.35

## 2018-05-31 NOTE — Discharge Summary (Signed)
Physician Discharge Summary  Patient ID: Samuel Weiss MRN: [id number] DOB/AGE: March 17, 1951 67 y.o.  Admit date: 05/29/2018 Discharge date: 05/31/2018  Admission Diagnoses: Multiple facial fractures  Discharge Diagnoses:  Active Problems:   Multiple facial fractures, open, initial encounter [facility])   Discharged Condition: stable  Hospital Course: The patient was admitted due to multiple facial fractures on September 13.  He was taken to the operating room on September 14 and placed under general anesthesia for open reduction internal infection of LeFort II level fractures and anterior mandibular symphysis fracture, closed reduction of bilateral nasal fractures, closed reduction of bilateral condylar fractures with maxillomandibular fixation, and surgical removal carious nonrestorable teeth.  Patient tolerated it well.  He was extubated.  He was admitted for overnight observation.  He progressed well and was tolerating a full liquid diet.  On postop day 1, his vital signs were stable and his pain was well controlled.  He was thus discharged home with instructions for follow-up in approximately 1 week.  Consults: None  Significant Diagnostic Studies: radiology: CT scan: Maxillofacial CT  Treatments: surgery: As described above  Discharge Exam: Blood pressure (!) 151/82, pulse (!) 116, temperature 98.1 F (36.7 C), temperature source Oral, resp. rate 16, height 5\' 8"  (1.727 m), weight 87.5 kg, SpO2 96 %. Gen: awake/alert, nad, sitting in chair watching tv HEENT: Moderate bilateral facial edema.  Nasal dorsal splint in place, Doyle splints in the bilateral nares secured.  Maxillomandibular fixation stable, occlusion stable.  Anterior clean/dry/intact.  Intraoral wounds intact. Hrt: mild tachycardia Lungs: clear Abd: s,nt,nd Neuro: bil CN V2 paresthesia  Disposition: Discharge disposition: 01-Home or Self Care        Allergies as of 05/31/2018   No Known Allergies     Medication  List    TAKE these medications   acetaminophen 325 MG tablet Commonly known as:  TYLENOL Take 2 tablets (650 mg total) by mouth every 6 (six) hours as needed for mild pain or moderate pain.   amoxicillin-clavulanate 250-62.5 MG/5ML suspension Commonly known as:  AUGMENTIN Take 15 mLs (750 mg total) by mouth 2 (two) times daily for 10 days.   aspirin EC 81 MG tablet Take 81 mg by mouth 2 (two) times a week.   atorvastatin 40 MG tablet Commonly known as:  LIPITOR TAKE 1 TABLET BY MOUTH ONCE DAILY   bacitracin ointment Apply to anterior chin wound twice daily after cleaning.   chlorhexidine 0.12 % solution Commonly known as:  PERIDEX Use as directed 15 mLs in the mouth or throat 3 (three) times daily.   ibuprofen 100 MG/5ML suspension Commonly known as:  ADVIL,MOTRIN Take 30 mLs (600 mg total) by mouth every 6 (six) hours as needed.   Olmesartan-amLODIPine-HCTZ 40-5-25 MG Tabs TAKE 1 TABLET BY MOUTH ONCE DAILY      Follow-up Information    Sulema Braid, Larkin Ina, DMD. Schedule an appointment as soon as possible for a visit in 1 week(s).   Specialty:  Dentistry Why:  call office to set up follow up appointment for 1 week Contact information: [address] [place] [address] [phone]           Signed: Michael Litter, DMD Oral and maxillofacial surgery 05/31/2018, 11:44 AM

## 2018-05-31 NOTE — Discharge Instructions (Signed)
1.  Clean anterior chin wound with 50-50 hydrogen peroxide/water solution with a Q-tip applicator twice daily.  Apply a thin coat of bacitracin ointment following. 2.  Use chlorhexidine rinses 3 times daily per instructions. 3.  Brush teeth/wires/arch bars 3 times daily. 4.  Keep head elevated for the next 5 days. 5.  Maintain high-protein full liquid diet until further notice. 6.  All intraoral sutures are dissolvable and will fall out on their own.

## 2018-05-31 NOTE — Progress Notes (Signed)
Pt is ambulating in the hall with steady gait. Facial swelling noted, nasal splint intact. Discharge instructions given to pt and girlfriend. Verbalized understanding. Pt is aware of the importance of oral care. Discharged to home accompanied by family.

## 2018-06-01 ENCOUNTER — Encounter (HOSPITAL_COMMUNITY): Payer: Self-pay | Admitting: Oral Surgery

## 2018-06-02 ENCOUNTER — Ambulatory Visit (INDEPENDENT_AMBULATORY_CARE_PROVIDER_SITE_OTHER): Payer: Medicare Other | Admitting: Internal Medicine

## 2018-06-02 VITALS — BP 152/87 | HR 90 | Temp 96.6°F | Ht 68.0 in | Wt 200.1 lb

## 2018-06-02 DIAGNOSIS — R55 Syncope and collapse: Secondary | ICD-10-CM | POA: Diagnosis not present

## 2018-06-02 DIAGNOSIS — E785 Hyperlipidemia, unspecified: Secondary | ICD-10-CM | POA: Diagnosis not present

## 2018-06-02 DIAGNOSIS — W19XXXD Unspecified fall, subsequent encounter: Secondary | ICD-10-CM

## 2018-06-02 DIAGNOSIS — I10 Essential (primary) hypertension: Secondary | ICD-10-CM

## 2018-06-02 DIAGNOSIS — Z79899 Other long term (current) drug therapy: Secondary | ICD-10-CM

## 2018-06-02 DIAGNOSIS — Z87898 Personal history of other specified conditions: Secondary | ICD-10-CM

## 2018-06-02 DIAGNOSIS — S0292XD Unspecified fracture of facial bones, subsequent encounter for fracture with routine healing: Secondary | ICD-10-CM | POA: Diagnosis not present

## 2018-06-02 DIAGNOSIS — Z720 Tobacco use: Secondary | ICD-10-CM

## 2018-06-02 MED ORDER — ATORVASTATIN CALCIUM 40 MG PO TABS: 40.0000 mg | ORAL_TABLET | Freq: Every day | ORAL | 3 refills | Status: DC

## 2018-06-02 NOTE — Patient Instructions (Addendum)
Thank you for allowing [place] taking care of you in the clinic today.  You can in for blood pressure follow-up.  Blood pressure today was (!) 152/87 That is moderately elevated. Please continue (Olmesartan-amlodipine-HCTZ) and will evaluate you again in our clinic in 2-weeks.  You also had a recent suspicious fall. To evaluate for that, we send you for Echocardiogram (heart ultrasound) and will do Holter monitoring. (Monitor your heart when you are at home) for 2 days. Please continue other medications and come back to our clinic in a week.  Please let us know if you have any concern or queation.  Thanks Dr Linna Hoff

## 2018-06-02 NOTE — Assessment & Plan Note (Signed)
BP 152/87 today.  Has been on olmesartan-amlodipine-HCTZ (40-5-25) daily.  Has history of syncope. We would keep the current dose to be evaluated next time.    -Continue current dose -Follow-up in clinic in 2 weeks  ( Had K:3.2 on last hospitalization. BMP added today.)

## 2018-06-02 NOTE — Progress Notes (Signed)
   CC: BP follow up and hospital follow up  HPI:  [name] is a 68 y.o. with past medical history of hypertension and hyperlipidemia came in for blood pressure as well as hospital. Patient was  hospitalized after facial/head injury after syncopal episode.  Found to have multiple facial fractures and underwent surgery last week.  Patient reports some transient dizziness versus vertigo this a.m. please see problem based assessment and plan for further details.  Past Medical History:  Diagnosis Date  . Hyperlipidemia   . Hypertension    Review of Systems:  Review of Systems  Constitutional: Negative for chills, fever and weight loss.  Eyes: Negative for blurred vision and double vision.  Respiratory: Negative for cough, hemoptysis, shortness of breath and wheezing.   Cardiovascular: Negative for chest pain, palpitations, orthopnea, leg swelling and PND.  Gastrointestinal: Negative for abdominal pain, blood in stool, constipation, diarrhea, heartburn, melena, nausea and vomiting.  Genitourinary: Negative for dysuria and urgency.  Neurological: Positive for dizziness and loss of consciousness. Negative for focal weakness, seizures, weakness and headaches.  Psychiatric/Behavioral: Negative for depression.   Social Hx: Smokes 1 to 2 cigarettes every day Alcohol 3/weeks Drugs: Occasional cocaine. Last: Last week   Physical Exam:  Vitals:   06/02/18 0854  BP: (!) 152/87  Pulse: 90  Temp: (!) 96.6 F (35.9 C)  TempSrc: Axillary  SpO2: 97%  Weight: 200 lb 1.6 oz (90.8 kg)  Height: 5\' 8"  (1.727 m)   Physical Exam  Constitutional: He appears well-developed and well-nourished.  HENT:  Post surgical splint on nose, Has jaw wiring. Sutures on chin Cardiovascular: Normal rate, regular rhythm and normal heart sounds. Exam reveals no gallop and no friction rub.  No murmur heard. Pulmonary/Chest: Effort normal and breath sounds normal. No respiratory distress. He has no wheezes.  He has no rales.  Abdominal: Soft. Bowel sounds are normal. He exhibits no distension. There is no tenderness.  Musculoskeletal: He exhibits no edema, tenderness or deformity.  Skin: Skin is warm and dry.  Psychiatric: He has a normal mood and affect.    Assessment & Plan:   See Encounters Tab for problem based charting.  Patient seen with Dr. Rebeca Alert

## 2018-06-03 NOTE — Assessment & Plan Note (Signed)
Patient has had one episode of syncope last week, resulted in multiple facial fractures that needed surgery.  Denies any prodromal symptoms.  Not was not associated with seizure symptoms.  EKG has been normal in hospital.  Also reports some dizziness this a.m. no chest pain no shortness of breath no palpitation.  We will evaluate him for cardiac etiology.   -Holter monitoring X 48 hours -Echocardiogram -Follow-up after results

## 2018-06-04 ENCOUNTER — Ambulatory Visit (HOSPITAL_COMMUNITY): Payer: Medicare Other | Attending: Cardiology

## 2018-06-04 DIAGNOSIS — Z87898 Personal history of other specified conditions: Secondary | ICD-10-CM | POA: Diagnosis not present

## 2018-06-04 DIAGNOSIS — Z720 Tobacco use: Secondary | ICD-10-CM | POA: Insufficient documentation

## 2018-06-04 DIAGNOSIS — I10 Essential (primary) hypertension: Secondary | ICD-10-CM | POA: Insufficient documentation

## 2018-06-04 DIAGNOSIS — R55 Syncope and collapse: Secondary | ICD-10-CM | POA: Diagnosis not present

## 2018-06-04 DIAGNOSIS — E785 Hyperlipidemia, unspecified: Secondary | ICD-10-CM | POA: Diagnosis not present

## 2018-06-09 ENCOUNTER — Other Ambulatory Visit: Payer: Self-pay | Admitting: Internal Medicine

## 2018-06-09 ENCOUNTER — Ambulatory Visit (INDEPENDENT_AMBULATORY_CARE_PROVIDER_SITE_OTHER): Payer: Medicare Other

## 2018-06-09 DIAGNOSIS — R55 Syncope and collapse: Secondary | ICD-10-CM

## 2018-06-09 DIAGNOSIS — R42 Dizziness and giddiness: Secondary | ICD-10-CM

## 2018-06-09 DIAGNOSIS — Z87898 Personal history of other specified conditions: Secondary | ICD-10-CM

## 2018-06-11 ENCOUNTER — Ambulatory Visit (INDEPENDENT_AMBULATORY_CARE_PROVIDER_SITE_OTHER): Payer: Medicare Other | Admitting: Internal Medicine

## 2018-06-11 ENCOUNTER — Other Ambulatory Visit: Payer: Self-pay

## 2018-06-11 VITALS — BP 128/74 | HR 86 | Temp 97.7°F | Ht 68.0 in | Wt 197.3 lb

## 2018-06-11 DIAGNOSIS — Z79899 Other long term (current) drug therapy: Secondary | ICD-10-CM | POA: Diagnosis not present

## 2018-06-11 DIAGNOSIS — Z8781 Personal history of (healed) traumatic fracture: Secondary | ICD-10-CM | POA: Diagnosis not present

## 2018-06-11 DIAGNOSIS — R55 Syncope and collapse: Secondary | ICD-10-CM

## 2018-06-11 DIAGNOSIS — I10 Essential (primary) hypertension: Secondary | ICD-10-CM | POA: Diagnosis not present

## 2018-06-11 DIAGNOSIS — Z9181 History of falling: Secondary | ICD-10-CM | POA: Diagnosis not present

## 2018-06-11 NOTE — Progress Notes (Signed)
   CC: To get the results of his echo and Holter monitor.  HPI:  Samuel Weiss is a 67 y.o. with past medical history significant for hypertension and a recent syncopal episode resulted in multiple facial fractures came today to get the results of his echo and Holter monitor.  Echo shows grade 1 diastolic dysfunction, normal ejection fraction and no hypokinesia.  He returned his Holter monitor this morning-no results are available at this time.  Patient did not had any other syncopal episode.  Occasionally becomes dizzy with change in position but no fall.  He described his dizziness as being lightheaded and denies any spinning of room.  He denies any tinnitus or hearing problem.  He is recovering well from his injuries still having wire in his mouth. When asked about hydration he does not drink much water throughout the day.  Please see assessment and plan for his conditions.  Past Medical History:  Diagnosis Date  . Hyperlipidemia   . Hypertension    Review of Systems: Negative except mentioned in HPI.  Physical Exam:  Vitals:   06/11/18 0835  BP: 128/74  Pulse: 86  Temp: 97.7 F (36.5 C)  TempSrc: Axillary  SpO2: 98%  Weight: 197 lb 4.8 oz (89.5 kg)  Height: 5\' 8"  (1.727 m)   Orthostatic VS for the past 24 hrs:  BP- Lying Pulse- Lying BP- Sitting Pulse- Sitting BP- Standing at 0 minutes Pulse- Standing at 0 minutes  06/11/18 0920 120/75 64 130/79 66 122/71 71    General: Vital signs reviewed.  Patient is well-developed and well-nourished, mildly dry mucous membrane in no acute distress and cooperative with exam.  Head: Normocephalic and atraumatic. Eyes: EOMI, conjunctivae normal, no scleral icterus.  Cardiovascular: RRR, S1 normal, S2 normal, no murmurs, gallops, or rubs. Pulmonary/Chest: Clear to auscultation bilaterally, no wheezes, rales, or rhonchi. Abdominal: Soft, non-tender, non-distended, BS +,  Extremities: No lower extremity edema bilaterally,   pulses symmetric and intact bilaterally. No cyanosis or clubbing. Neurological: A&O x3, Strength is normal and symmetric bilaterally, cranial nerve II-XII are grossly intact, no focal motor deficit, sensory intact to light touch bilaterally. Gait normal, little wobbly while walking on a straight line, heel shin test normal, finger-nose test normal, Romberg negative. Skin: Warm, dry and intact. No rashes or erythema. Psychiatric: Normal mood and affect. speech and behavior is normal. Cognition and memory are normal.  Assessment & Plan:   See Encounters Tab for problem based charting.  Patient discussed with Dr. Daryll Drown.

## 2018-06-11 NOTE — Assessment & Plan Note (Signed)
Still undetermined etiology. No more syncopal episodes. Orthostatic vitals were negative. No cerebellar signs except being little wobbly while walking on a straight line. Echo within normal limit. Holter monitor reading is not available yet.  -We will call the patient once Holter monitoring reading becomes available. -Asked the patient to keep himself well-hydrated as he is using a diuretic.

## 2018-06-11 NOTE — Patient Instructions (Addendum)
Thank you for visiting clinic today. You are having little low potassium and worsening of your renal function when you were hospitalized, we will recheck your renal function and electrolytes today, will call you with any abnormal results. As we discussed your echo, the ultrasound of your heart looks normal. We will call you once we will get the results from your heart monitor. Your blood pressure seems within normal limit and there is no drop with change in position. Please keep yourself well-hydrated, it is very important as you are blood pressure medicine has a water pill and it that can make you dehydrated. Please follow-up in 1 month with your PCP.

## 2018-06-11 NOTE — Assessment & Plan Note (Addendum)
BP Readings from Last 3 Encounters:  06/11/18 128/74  06/02/18 (!) 152/87  05/31/18 (!) 151/82   He was normotensive today with negative orthostatic vitals.  We will continue current management with a combination pill of olmesartan-amlodipine-HCTZ. Asked the patient to keep himself well-hydrated. He was having hypokalemia and worsening of his renal function when checked during his recent hospitalization. Repeat BMP today. Keep monitoring.  Addendum.  Repeat BMP is within normal limit. We will continue with current management.

## 2018-06-12 LAB — BMP8+ANION GAP
ANION GAP: 17 mmol/L (ref 10.0–18.0)
BUN/Creatinine Ratio: 12 (ref 10–24)
BUN: 14 mg/dL (ref 8–27)
CALCIUM: 9.8 mg/dL (ref 8.6–10.2)
CHLORIDE: 99 mmol/L (ref 96–106)
CO2: 25 mmol/L (ref 20–29)
Creatinine, Ser: 1.17 mg/dL (ref 0.76–1.27)
GFR calc Af Amer: 73 mL/min/{1.73_m2} (ref >59)
GFR, EST NON AFRICAN AMERICAN: 63 mL/min/{1.73_m2} (ref >59)
GLUCOSE: 88 mg/dL (ref 65–99)
Potassium: 4.7 mmol/L (ref 3.5–5.2)
SODIUM: 141 mmol/L (ref 134–144)

## 2018-06-12 NOTE — Progress Notes (Signed)
Internal Medicine Clinic Attending  I saw and evaluated the patient.  I personally confirmed the key portions of the history and exam documented by Dr. Myrtie Hawk and I reviewed pertinent patient test results.  The assessment, diagnosis, and plan were formulated together and I agree with the documentation in the resident's note.  Concerning episode of syncope of unknown etiology resulting in facial fracture. Minimal workup for etiology thus far. Concerning for cardiac arrhythmia, will start with Holter monitor, may need additional workup if negative.   Lenice Pressman, M.D., Ph.D.

## 2018-06-16 NOTE — Progress Notes (Signed)
Internal Medicine Clinic Attending  Case discussed with Dr. Amin at the time of the visit.  We reviewed the resident's history and exam and pertinent patient test results.  I agree with the assessment, diagnosis, and plan of care documented in the resident's note.    

## 2018-06-17 ENCOUNTER — Telehealth: Payer: Self-pay | Admitting: *Deleted

## 2018-06-17 NOTE — Telephone Encounter (Signed)
Call from [facility], concern about pt's BP. Stated BP's today have been 96/56 and 91/71. I asked if pt has been drinking plenty of fluid she stated no, not really today.Last OV, his BP was 128/74. Informed pt is on a med with a water pill (which was part of AVS instructions to keep hydrated).  Appt scheduled for tomorrow in [facility] @ 1345PM; instructed to continue monitoring his BP and if continues to drop, take pt to UC or ED-stated she will.

## 2018-06-17 NOTE — Telephone Encounter (Signed)
I agree. Please ask him to stop taking the combo olmesartan-amlodipine-HCTZ. We can see him tomorrow and adjust. I agree with seeking care sooner if he has symptoms of low BP like syncope, light headed, or chest pain.

## 2018-06-18 ENCOUNTER — Ambulatory Visit (INDEPENDENT_AMBULATORY_CARE_PROVIDER_SITE_OTHER): Payer: Medicare Other | Admitting: Internal Medicine

## 2018-06-18 ENCOUNTER — Encounter: Payer: Self-pay | Admitting: Internal Medicine

## 2018-06-18 ENCOUNTER — Other Ambulatory Visit: Payer: Self-pay

## 2018-06-18 VITALS — BP 119/68 | HR 70 | Temp 98.5°F | Ht 68.0 in | Wt 198.6 lb

## 2018-06-18 DIAGNOSIS — Z969 Presence of functional implant, unspecified: Secondary | ICD-10-CM

## 2018-06-18 DIAGNOSIS — Z79899 Other long term (current) drug therapy: Secondary | ICD-10-CM

## 2018-06-18 DIAGNOSIS — Z9181 History of falling: Secondary | ICD-10-CM | POA: Diagnosis not present

## 2018-06-18 DIAGNOSIS — R031 Nonspecific low blood-pressure reading: Secondary | ICD-10-CM

## 2018-06-18 DIAGNOSIS — Z8781 Personal history of (healed) traumatic fracture: Secondary | ICD-10-CM

## 2018-06-18 DIAGNOSIS — R55 Syncope and collapse: Secondary | ICD-10-CM

## 2018-06-18 DIAGNOSIS — I10 Essential (primary) hypertension: Secondary | ICD-10-CM

## 2018-06-18 DIAGNOSIS — E785 Hyperlipidemia, unspecified: Secondary | ICD-10-CM | POA: Diagnosis not present

## 2018-06-18 MED ORDER — AMLODIPINE-OLMESARTAN 5-40 MG PO TABS: 1.0000 | ORAL_TABLET | Freq: Every day | ORAL | 0 refills | Status: DC

## 2018-06-18 NOTE — Assessment & Plan Note (Signed)
The patient's blood pressure during this visit was 119/68. The patient is currently on combination pill of olmesartan-amlodipine-hctz 40-5-25mg . The patient has been having blood pressure readings with systolics in the 09M and diastolics in the 30-14F. However, when he went to the nearby pharmacy to recheck the readings the patient had normal blood pressure readings.   The patient has been crushing his blood pressure medication due to his recent mandibular fracture.  Assessment and plan  The patient has lower normal blood pressure readings documented during visit today. Orthostatic vital signs are negative. Since the patient has had continued symptoms of dizziness and lightheadedness will discontinue hydrochlorothiazide. It is possible that the patient crushing his tablet may contribute to a more immediate decrease in his blood pressure and thereby be contributing to his low bp readings.   -Prescribed olmesartan-amlodipine 40-5mg  qd without hctz component

## 2018-06-18 NOTE — Telephone Encounter (Signed)
Called pt - stated he has already taken the pill this morning. Stated he will check his BP and drink some fluids today; aware of his appt today @ 1345 PM.

## 2018-06-18 NOTE — Assessment & Plan Note (Signed)
The patient had one episode of syncope resulting in traumatic facial fractures on 05/29/18. The patient. The patient is undergoing a syncope work up. He has had holter monitor placed and we are waiting for results. His orthostatic vital signs were again negative during this visit. It is possible that his syncope was substance related (from alcohol) as he had an high alcohol level of 98 prior to his admission for syncope on 9/13.   -Will wait on holter monitor readings -Encourage patient to continue to hydrate himself well -Discontinued hctz

## 2018-06-18 NOTE — Patient Instructions (Signed)
It was a pleasure to see you today Samuel Weiss. Please make the following changes:  I have removed one medication (hydrochlorothiazide) from your blood pressure tablet and have prescribed a different tablet for you. Please take osmesartan-amlodipine 40-5mg  daily and follow up with Dr. Alfonse Spruce at your next appointment.   For your pain, you may take an extra half dose of ibuprofen on an as needed basis. Please limit your total ibuprofen intake per day to 2400mg .   If you have any questions or concerns, please call our clinic at [phone] between 9am-5pm and after hours call [phone] and ask for the internal medicine resident on call. If you feel you are having a medical emergency please call 911.   Thank you, we look forward to help you remain healthy!  Lars Mage, MD Internal Medicine PGY2

## 2018-06-18 NOTE — Progress Notes (Signed)
   CC: low blood pressure reading  HPI:  [name] is a 67 y.o. male with essential hypertension, hyperlipidemia, and history of syncope presented for follow up regarding low blood pressure readings documented at home. Please see problem based charting for evaluation, assessment, and plan.  Past Medical History:  Diagnosis Date  . Hyperlipidemia   . Hypertension    Review of Systems:    Physical Exam  Constitutional: He appears well-developed and well-nourished. No distress.  HENT:  Head: Normocephalic and atraumatic.  Patient with mechanical hardware in place  Eyes: Conjunctivae are normal.  Cardiovascular: Normal rate, regular rhythm and normal heart sounds.  Pulmonary/Chest: Breath sounds normal. No respiratory distress. He has no wheezes.  Abdominal: Soft. Bowel sounds are normal. He exhibits distension. There is no tenderness.  Musculoskeletal: He exhibits no edema.  Neurological: He is alert.  Skin: He is not diaphoretic.  Psychiatric: He has a normal mood and affect. His behavior is normal. Judgment and thought content normal.    Physical Exam:  Vitals:   06/18/18 1423 06/18/18 1425  BP: 119/68   Pulse: 70   Temp:  98.5 F (36.9 C)  TempSrc:  Oral  SpO2: 99%   Weight: 198 lb 9.6 oz (90.1 kg)   Height: 5\' 8"  (1.727 m)    Physical Exam  Constitutional: He appears well-developed and well-nourished. No distress.  HENT:  Head: Normocephalic and atraumatic.  Patient with mechanical hardware in place  Eyes: Conjunctivae are normal.  Cardiovascular: Normal rate, regular rhythm and normal heart sounds.  Respiratory: Breath sounds normal. No respiratory distress. He has no wheezes.  GI: Soft. Bowel sounds are normal. He exhibits distension. There is no tenderness.  Musculoskeletal: He exhibits no edema.  Neurological: He is alert.  Skin: He is not diaphoretic.  Psychiatric: He has a normal mood and affect. His behavior is normal. Judgment and thought  content normal.    Assessment & Plan:   See Encounters Tab for problem based charting.  Patient discussed with Dr. Rebeca Alert

## 2018-06-22 NOTE — Progress Notes (Signed)
Internal Medicine Clinic Attending  Case discussed with Dr. Chundi at the time of the visit.  We reviewed the resident's history and exam and pertinent patient test results.  I agree with the assessment, diagnosis, and plan of care documented in the resident's note.  Alexander Raines, M.D., Ph.D.  

## 2018-07-08 ENCOUNTER — Other Ambulatory Visit: Payer: Self-pay

## 2018-07-08 ENCOUNTER — Ambulatory Visit (INDEPENDENT_AMBULATORY_CARE_PROVIDER_SITE_OTHER): Payer: Medicare Other | Admitting: Internal Medicine

## 2018-07-08 VITALS — BP 137/80 | HR 83 | Temp 97.5°F | Ht 68.0 in | Wt 198.2 lb

## 2018-07-08 DIAGNOSIS — Z79899 Other long term (current) drug therapy: Secondary | ICD-10-CM | POA: Diagnosis not present

## 2018-07-08 DIAGNOSIS — I10 Essential (primary) hypertension: Secondary | ICD-10-CM | POA: Diagnosis not present

## 2018-07-08 DIAGNOSIS — S0292XD Unspecified fracture of facial bones, subsequent encounter for fracture with routine healing: Secondary | ICD-10-CM

## 2018-07-08 DIAGNOSIS — E785 Hyperlipidemia, unspecified: Secondary | ICD-10-CM

## 2018-07-08 DIAGNOSIS — W1839XD Other fall on same level, subsequent encounter: Secondary | ICD-10-CM

## 2018-07-08 DIAGNOSIS — R55 Syncope and collapse: Secondary | ICD-10-CM

## 2018-07-08 NOTE — Assessment & Plan Note (Signed)
Assessment: His blood pressure is well controlled on amlodipine-olmesartan 5-40 mg daily.  Plan: 1.  Continue amlodipine-olmesartan 5-40 mg daily.

## 2018-07-08 NOTE — Assessment & Plan Note (Signed)
Assessment: He is currently being treated with atorvastatin 40 mg daily and reports good compliance.  Plan:  1.  Continue atorvastatin 40 mill grams daily

## 2018-07-08 NOTE — Assessment & Plan Note (Addendum)
Assessment: Samuel Weiss had one episode of syncope resulting in traumatic facial pressures on 05/29/2018.  He had a 48 hour Holter monitor placed which did not show signs of an arrhythmia.  He also had an echo which showed an EF of 65 to 70% with grade 1 diastolic dysfunction.  His syncope was most likely secondary to alcohol intoxication (alcohol level 98 on admission).   Plan: 1.  No further work-up needed

## 2018-07-08 NOTE — Progress Notes (Signed)
   CC: Follow-up of syncope  HPI:  [name] is a 67 y.o. male with hypertension and previous syncope which resulted in traumatic facial fractures (05/2018) who presents for of syncope  Hypertension: He is currently being treated with amlodipine-olmesartan 5-40 mg daily.  His blood pressure today is 137/80.  Syncope which resulted in traumatic facial fractures: [name] had a syncopal episode in September 2019.  He states that he gotten out of a friend's car and suddenly passed out and fell to the floor.  He denies any prodromal symptoms.  He does state that he did drink several alcoholic beverages but does not believe that he was drunk or stumbling around.  His alcohol level was found to be elevated at the time.  He was also found to have an elevated creatinine at 1.65 (baseline is around 1.1) and microcytic anemia with hemoglobin of 12.5.  He had an echo performed which showed EF of 65 to 70% with grade 1 diastolic dysfunction.  There was no gross valvular abnormalities noted.  A Holter monitor was placed and showed sinus rhythm with heart rate ranging from 47-113 bpm.  It did not show any signs of a sustained arrhythmia or prolonged pauses of heart rhythm.  He was on a combination to hypertensive medication (amlodipine-olmesartan-HCTZ).  The diuretic portion of his antihypertensive was thought to have contributed to his syncopal episode and was subsequently changed to amlodipine-olmesartan.  Since this episode he has not had any new syncope.  He has not felt dizzy, had chest pain or shortness of breath.  He states that he feels back to his baseline.  Hyperlipidemia He is currently being treated with atorvastatin 40 mg daily.  Reports good compliance with his medication  Past Medical History:  Diagnosis Date  . Hyperlipidemia   . Hypertension    Review of Systems:  Review of Systems  Constitutional: Negative for chills, fever and weight loss.  HENT: Negative for congestion,  sinus pain and sore throat.   Respiratory: Negative for cough and shortness of breath.   Cardiovascular: Negative for chest pain, palpitations and leg swelling.  Gastrointestinal: Negative for abdominal pain, constipation, diarrhea and vomiting.  Genitourinary: Negative for dysuria and hematuria.  Skin: Negative for rash.  Neurological: Negative for dizziness, weakness and headaches.  All other systems reviewed and are negative.   Physical Exam:  Vitals:   07/08/18 1519  BP: 137/80  Pulse: 83  Temp: (!) 97.5 F (36.4 C)  TempSrc: Oral  SpO2: 100%  Weight: 198 lb 3.2 oz (89.9 kg)  Height: 5\' 8"  (1.727 m)   Physical Exam  Constitutional: He appears well-developed and well-nourished.  HENT:  Head: Normocephalic and atraumatic.  Eyes: EOM are normal.  Neck: Normal range of motion.  Cardiovascular: Normal rate, regular rhythm and normal heart sounds.  Pulmonary/Chest: Effort normal and breath sounds normal.  Musculoskeletal: He exhibits no edema or tenderness.  Neurological: He is alert.  Skin: Skin is warm and dry.  Psychiatric: He has a normal mood and affect. His behavior is normal.  Nursing note and vitals reviewed.   Assessment & Plan:   See Encounters Tab for problem based charting.  Patient seen with Dr. Beryle Beams

## 2018-07-08 NOTE — Progress Notes (Signed)
Medicine attending: I personally interviewed and briefly examined this patient on the day of the patient visit and reviewed pertinent clinical ,laboratory, and radiographic data  with resident physician Dr. Nita Sickle and we discussed a management plan. Ongoing follow up for recent syncopal episode; multiple facial fractures; required surgery; normal EKG & echocardiogram; 48 hr monitor; fell flat on his face after getting out of his friend's car; no seizure activity noted; police arrived on scene; pt states he revived quickly, was aware of his surroundings. Hot day; BUN & creatinine elevated above baseline; ETOH level 80 but he states he was not drunk.  No further eval planned at this time.

## 2018-07-21 DIAGNOSIS — H5203 Hypermetropia, bilateral: Secondary | ICD-10-CM | POA: Diagnosis not present

## 2018-09-02 ENCOUNTER — Other Ambulatory Visit: Payer: Self-pay

## 2018-09-02 DIAGNOSIS — E785 Hyperlipidemia, unspecified: Secondary | ICD-10-CM

## 2018-09-02 MED ORDER — ATORVASTATIN CALCIUM 40 MG PO TABS: 40.0000 mg | ORAL_TABLET | Freq: Every day | ORAL | 0 refills | Status: DC

## 2018-09-02 MED ORDER — AMLODIPINE-OLMESARTAN 5-40 MG PO TABS: 1.0000 | ORAL_TABLET | Freq: Every day | ORAL | 0 refills | Status: DC

## 2018-09-02 NOTE — Telephone Encounter (Signed)
amLODipine-olmesartan (AZOR) 5-40 MG tablet  atorvastatin (LIPITOR) 40 MG tablet, refill request @  [id number] [address], [place] (Phone) [phone] (Fax)

## 2018-10-23 ENCOUNTER — Encounter: Payer: Self-pay | Admitting: Internal Medicine

## 2018-10-23 ENCOUNTER — Ambulatory Visit (INDEPENDENT_AMBULATORY_CARE_PROVIDER_SITE_OTHER): Payer: Medicare Other | Admitting: Internal Medicine

## 2018-10-23 ENCOUNTER — Other Ambulatory Visit: Payer: Self-pay

## 2018-10-23 VITALS — BP 166/82 | HR 100 | Temp 99.5°F | Ht 68.0 in | Wt 209.0 lb

## 2018-10-23 DIAGNOSIS — Z0000 Encounter for general adult medical examination without abnormal findings: Secondary | ICD-10-CM | POA: Diagnosis not present

## 2018-10-23 DIAGNOSIS — Z6831 Body mass index (BMI) 31.0-31.9, adult: Secondary | ICD-10-CM

## 2018-10-23 DIAGNOSIS — R0600 Dyspnea, unspecified: Secondary | ICD-10-CM

## 2018-10-23 DIAGNOSIS — R600 Localized edema: Secondary | ICD-10-CM | POA: Diagnosis not present

## 2018-10-23 LAB — POCT GLYCOSYLATED HEMOGLOBIN (HGB A1C): Hemoglobin A1C: 5.9 % — AB (ref 4.0–5.6)

## 2018-10-23 LAB — GLUCOSE, CAPILLARY: Glucose-Capillary: 83 mg/dL (ref 70–99)

## 2018-10-23 NOTE — Progress Notes (Signed)
   CC: Bilateral lower extremity edema  HPI:  [name] is a 68 y.o. male with HLD and HTN who presents with an acute onset of bilateral lower extremity edema.  He noticed lower extremity edema when he was taking a shower.  Since onset his lower extremity edema has actually improved but still remains.  He does not know if it improves when lying down or worsens with prolonged standing.  He is also had distention of his abdomen but believes that this is likely due to eating more over the last several months and gaining weight.  He has also gotten more short of breath with walking recently but believes its because he has become out of shape. He denies any chest pain, palpitations, orthopnea, and PND.  He denies abdominal pain, changes in bowel movements, urinary symptoms.  Past Medical History:  Diagnosis Date  . Hyperlipidemia   . Hypertension    Review of Systems:   Review of Systems  Constitutional: Negative for chills and fever.  HENT: Negative for congestion.   Respiratory: Negative for cough and shortness of breath.   Cardiovascular: Positive for leg swelling. Negative for chest pain, palpitations, orthopnea and PND.  Gastrointestinal: Negative for abdominal pain, constipation and vomiting.  Genitourinary: Negative for dysuria, frequency, hematuria and urgency.  All other systems reviewed and are negative.  Physical Exam:  Vitals:   10/23/18 1426  BP: (!) 166/82  Pulse: 100  Temp: 99.5 F (37.5 C)  TempSrc: Oral  SpO2: 96%  Weight: 209 lb (94.8 kg)  Height: 5\' 8"  (1.727 m)   Physical Exam Vitals signs and nursing note reviewed.  Constitutional:      Comments: Obese male sitting up in a chair no acute distress.  HENT:     Head: Normocephalic and atraumatic.  Cardiovascular:     Rate and Rhythm: Normal rate and regular rhythm.     Pulses: Normal pulses.     Heart sounds: Normal heart sounds. No murmur.  Pulmonary:     Effort: Pulmonary effort is normal. No  respiratory distress.     Breath sounds: Normal breath sounds.  Abdominal:     General: Abdomen is flat. There is distension.     Palpations: Abdomen is soft.     Comments: No apparent ascites or pitting edema.  Musculoskeletal:     Comments: 1+ pitting edema to the mid calf bilaterally.  No tenderness to palpation or erythema of the lower extremities.  Skin:    General: Skin is warm and dry.  Neurological:     Mental Status: He is oriented to person, place, and time. Mental status is at baseline.  Psychiatric:        Mood and Affect: Mood normal.        Behavior: Behavior normal.      Assessment & Plan:   See Encounters Tab for problem based charting.  Patient discussed with Dr. Dareen Piano

## 2018-10-23 NOTE — Assessment & Plan Note (Signed)
He wishes to have an A1c today to check for diabetes.  CBGs in the past have all been within normal limits.  Will order A1c today.

## 2018-10-23 NOTE — Assessment & Plan Note (Addendum)
Assessment: He presents with new onset lower extremity edema and dyspnea on exertion.  He did have 1+ pitting edema to lower extremities bilaterally as well as distended abdomen although I believe that this is more likely due to his obesity then edema.  He does have reassuring vital signs including normal oxygenation. Differential diagnosis includes new onset heart failure (echo in September 2019 showed normal EF with grade 1 diastolic dysfunction; he has multiple risk factors including HTN and HLD), liver disease, and kidney disease.  He may also have venous insufficiency although this would not explain his shortness of breath with walking.  Plan: 1.  Ordered echo 2.  Ordered CMP 3.  Recommended compression stockings and elevating lower extremities.  ADDENDUM 11/04/18: CMP unremarkable. ECHO with normal EF, no filling defects, no valvular disease.  LE edema unlikely to be due to liver, kidney, or heart disease. I do believe that it is likely to venous insufficiency and recommend continued compression stockings and elevation of LEs.

## 2018-10-23 NOTE — Patient Instructions (Signed)
Please use compression stockings and elevate your feet.

## 2018-10-24 LAB — CMP14 + ANION GAP
ALBUMIN: 4.6 g/dL (ref 3.8–4.8)
ALK PHOS: 93 IU/L (ref 39–117)
ALT: 41 IU/L (ref 0–44)
AST: 31 IU/L (ref 0–40)
Albumin/Globulin Ratio: 1.4 (ref 1.2–2.2)
Anion Gap: 20 mmol/L — ABNORMAL HIGH (ref 10.0–18.0)
BILIRUBIN TOTAL: 0.3 mg/dL (ref 0.0–1.2)
BUN/Creatinine Ratio: 11 (ref 10–24)
BUN: 12 mg/dL (ref 8–27)
CHLORIDE: 101 mmol/L (ref 96–106)
CO2: 21 mmol/L (ref 20–29)
Calcium: 9.9 mg/dL (ref 8.6–10.2)
Creatinine, Ser: 1.13 mg/dL (ref 0.76–1.27)
GFR calc Af Amer: 76 mL/min/{1.73_m2} (ref >59)
GFR calc non Af Amer: 66 mL/min/{1.73_m2} (ref >59)
GLOBULIN, TOTAL: 3.2 g/dL (ref 1.5–4.5)
Glucose: 77 mg/dL (ref 65–99)
POTASSIUM: 4.9 mmol/L (ref 3.5–5.2)
Sodium: 142 mmol/L (ref 134–144)
TOTAL PROTEIN: 7.8 g/dL (ref 6.0–8.5)

## 2018-10-26 NOTE — Progress Notes (Signed)
Internal Medicine Clinic Attending  Case discussed with Dr. Prince at the time of the visit.  We reviewed the resident's history and exam and pertinent patient test results.  I agree with the assessment, diagnosis, and plan of care documented in the resident's note.   

## 2018-10-30 ENCOUNTER — Ambulatory Visit (HOSPITAL_COMMUNITY)
Admission: RE | Admit: 2018-10-30 | Discharge: 2018-10-30 | Disposition: A | Discharge status: Home / Self Care | Payer: Medicare Other | Source: Ambulatory Visit | Attending: Internal Medicine | Admitting: Internal Medicine

## 2018-10-30 DIAGNOSIS — R600 Localized edema: Secondary | ICD-10-CM

## 2018-10-30 DIAGNOSIS — I10 Essential (primary) hypertension: Secondary | ICD-10-CM | POA: Diagnosis not present

## 2018-10-30 DIAGNOSIS — E785 Hyperlipidemia, unspecified: Secondary | ICD-10-CM | POA: Insufficient documentation

## 2018-10-30 NOTE — Progress Notes (Signed)
Echocardiogram 2D Echocardiogram has been performed.  10/30/2018 4:03 PM Maudry Mayhew, MHA, RVT, RDCS, RDMS

## 2018-11-04 ENCOUNTER — Other Ambulatory Visit: Payer: Self-pay | Admitting: Internal Medicine

## 2018-11-04 DIAGNOSIS — I10 Essential (primary) hypertension: Secondary | ICD-10-CM

## 2018-11-04 MED ORDER — AMLODIPINE-OLMESARTAN 5-40 MG PO TABS: 1.0000 | ORAL_TABLET | Freq: Every day | ORAL | 3 refills | Status: DC

## 2018-11-30 ENCOUNTER — Other Ambulatory Visit: Payer: Self-pay | Admitting: Internal Medicine

## 2018-11-30 DIAGNOSIS — E785 Hyperlipidemia, unspecified: Secondary | ICD-10-CM

## 2018-12-10 DIAGNOSIS — M21612 Bunion of left foot: Secondary | ICD-10-CM | POA: Diagnosis not present

## 2018-12-10 DIAGNOSIS — D2372 Other benign neoplasm of skin of left lower limb, including hip: Secondary | ICD-10-CM | POA: Diagnosis not present

## 2018-12-10 DIAGNOSIS — D2371 Other benign neoplasm of skin of right lower limb, including hip: Secondary | ICD-10-CM | POA: Diagnosis not present

## 2018-12-10 DIAGNOSIS — M21611 Bunion of right foot: Secondary | ICD-10-CM | POA: Diagnosis not present

## 2019-02-15 DIAGNOSIS — D2371 Other benign neoplasm of skin of right lower limb, including hip: Secondary | ICD-10-CM | POA: Diagnosis not present

## 2019-02-15 DIAGNOSIS — D2372 Other benign neoplasm of skin of left lower limb, including hip: Secondary | ICD-10-CM | POA: Diagnosis not present

## 2019-02-15 DIAGNOSIS — M21962 Unspecified acquired deformity of left lower leg: Secondary | ICD-10-CM | POA: Diagnosis not present

## 2019-02-15 DIAGNOSIS — M21961 Unspecified acquired deformity of right lower leg: Secondary | ICD-10-CM | POA: Diagnosis not present

## 2019-03-07 ENCOUNTER — Encounter: Payer: Self-pay | Admitting: *Deleted

## 2019-04-07 ENCOUNTER — Other Ambulatory Visit: Payer: Self-pay

## 2019-04-07 ENCOUNTER — Ambulatory Visit (INDEPENDENT_AMBULATORY_CARE_PROVIDER_SITE_OTHER): Payer: Medicare Other | Admitting: Internal Medicine

## 2019-04-07 ENCOUNTER — Encounter: Payer: Self-pay | Admitting: Internal Medicine

## 2019-04-07 VITALS — BP 153/86 | HR 70 | Temp 98.6°F | Ht 68.0 in | Wt 216.3 lb

## 2019-04-07 DIAGNOSIS — R42 Dizziness and giddiness: Secondary | ICD-10-CM | POA: Insufficient documentation

## 2019-04-07 DIAGNOSIS — I10 Essential (primary) hypertension: Secondary | ICD-10-CM

## 2019-04-07 DIAGNOSIS — Z79899 Other long term (current) drug therapy: Secondary | ICD-10-CM | POA: Diagnosis not present

## 2019-04-07 DIAGNOSIS — R208 Other disturbances of skin sensation: Secondary | ICD-10-CM

## 2019-04-07 HISTORY — DX: Dizziness and giddiness: R42

## 2019-04-07 NOTE — Assessment & Plan Note (Addendum)
Samuel Weiss reports occasional dizziness that started few months ago and got more frequent (2-3 times weekly) in past few weeks. (He had a fall in September and blacked out. He was hospitalized for that.) Since the fall he sometimes has the dizziness. (He is not sure if he had dizziness prior to that but thinks that it might have be the reason for fall) He also mentions that he was told that he was dehydrated.  His dizziness last few seconds and mostly when he changes his position and some times when he gets up. No orthostatic hypotension on exam. (He is currently symptom free) No neurologic sign or symptoms.   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 Per his preference and also since the episodes last only for few seconds, we hold off of symptoms therapy for now and will monitor. Although he did not have orthostatic changes today, I recommended him to check his BP more frequently at home and see if he has ant low BP at the time he has dizziness. Also recommencing to keep himself hydrated.

## 2019-04-07 NOTE — Progress Notes (Signed)
   CC: Dizziness  HPI:  [name] is a 68 y.o. with PMHx as documented below, presented with dizziness. Please refer to problem based charting for further details and assessment and plan of current problem and chronic medical conditions.   Past Medical History:  Diagnosis Date  . Hyperlipidemia   . Hypertension    Review of Systems:  Review of Systems  HENT: Negative for congestion, ear discharge, ear pain and hearing loss.   Neurological: Positive for dizziness and sensory change. Negative for weakness.   Physical Exam:  Vitals:   04/07/19 0853  BP: (!) 153/86  Pulse: 70  Temp: 98.6 F (37 C)  TempSrc: Oral  SpO2: 99%  Weight: 216 lb 4.8 oz (98.1 kg)  Height: 5\' 8"  (1.727 m)   Physical Exam Constitutional:      General: He is not in acute distress. HENT:     Head: Normocephalic and atraumatic.     Right Ear: Tympanic membrane and ear canal normal.     Left Ear: Tympanic membrane and ear canal normal.  Eyes:     Extraocular Movements: Extraocular movements intact.     Pupils: Pupils are equal, round, and reactive to light.  Cardiovascular:     Rate and Rhythm: Normal rate and regular rhythm.     Heart sounds: No murmur.  Neurological:     General: No focal deficit present.     Mental Status: He is oriented to person, place, and time.     Cranial Nerves: No cranial nerve deficit.     Sensory: No sensory deficit.     Motor: No weakness.     Coordination: Coordination normal.     Gait: Gait normal.     Comments: No nystagmus     Assessment & Plan:   See Encounters Tab for problem based charting.  Patient discussed with Dr. Angelia Mould

## 2019-04-07 NOTE — Patient Instructions (Signed)
It was our pleasure taking care of you in our clinic today.  You were seen due to dizziness.  Your physical exam is reassuring.  And since your dizziness lasts only for a few seconds and not associated with any other concerning issue, and per your preference I do not start you on any medication.  Please check your blood pressure at home when you feel dizzy, make sure you keep yourself hydrated and eat properly.  If you did not feel better, please come back to clinic in 2 weeks.   please take your prior medications as before. Contact us if you have any question or concern.  As always, if having severe symptoms, please seek medical attention at emergency room.  Thank you

## 2019-04-07 NOTE — Assessment & Plan Note (Addendum)
The patient complains of dizziness. The patient's blood pressure during this visit is elevated at 153/86 and was elevated at 166/88 last visit. The patient is currently taking Amlodipine-Olmesartan 5-40 mg QD.  No orthostatic changes currently but still conservative about increasing his BP meds today given Hx of dizziness when he gets up in the morning.  I advised him to check his BP at home more frequently and during episodes of dizziness and bring the BP log to clinic next time. May consider escalating medications as needed next time.

## 2019-04-08 ENCOUNTER — Encounter: Payer: Self-pay | Admitting: Internal Medicine

## 2019-04-09 NOTE — Progress Notes (Signed)
Internal Medicine Clinic Attending  Case discussed with Dr. Masoudi  at the time of the visit.  We reviewed the resident's history and exam and pertinent patient test results.  I agree with the assessment, diagnosis, and plan of care documented in the resident's note.  

## 2019-05-14 ENCOUNTER — Other Ambulatory Visit: Payer: Self-pay

## 2019-05-14 DIAGNOSIS — R6889 Other general symptoms and signs: Secondary | ICD-10-CM | POA: Diagnosis not present

## 2019-05-14 DIAGNOSIS — Z20822 Contact with and (suspected) exposure to covid-19: Secondary | ICD-10-CM

## 2019-05-16 LAB — NOVEL CORONAVIRUS, NAA: SARS-CoV-2, NAA: NOT DETECTED

## 2019-06-03 ENCOUNTER — Other Ambulatory Visit: Payer: Self-pay | Admitting: *Deleted

## 2019-06-03 DIAGNOSIS — E785 Hyperlipidemia, unspecified: Secondary | ICD-10-CM

## 2019-06-03 NOTE — Telephone Encounter (Signed)
Pt called back and sch an appt for 07/06/2019.

## 2019-06-04 MED ORDER — ATORVASTATIN CALCIUM 40 MG PO TABS: 40.0000 mg | ORAL_TABLET | Freq: Every day | ORAL | 1 refills | Status: DC

## 2019-06-16 ENCOUNTER — Other Ambulatory Visit: Payer: Self-pay | Admitting: Internal Medicine

## 2019-06-16 DIAGNOSIS — Z1211 Encounter for screening for malignant neoplasm of colon: Secondary | ICD-10-CM

## 2019-06-16 NOTE — Progress Notes (Unsigned)
Dr Darrick Meigs Your patient has been identified as being overdue for colorectal cancer screening and indicated to a [facility]i call that he/she is interested in colonoscopy.  A brief prescreen by [facility] has identified no health barriers to safely undergoing a colonoscopy.  If you agree with referral to GI to discuss screening colonoscopy for your patient, please sign the attached referral order.  If you disagree with a referral, please let me know with a short explanation.  Thank you Dr Lynnae January on behalf of [facility]

## 2019-07-05 NOTE — Assessment & Plan Note (Addendum)
Medications: amlodipine-olmasartan 5-40mg  Seen in our clinic in July at which time his blood pressures were fairly elevated with SBP >150. Due to vertigo sx at that time, no medication changes were made. Pt reports compliance with medication. Denies dry cough, orthostatic lightheadedness, headaches and palpitations BP remains elevated at today's visit. Pt reports home blood pressures [id number] systolically. BMP today wnl. Plan No medications changes today. Continue current mangement. Pt agrees to work on other lifestyle management including diet and exercise. Will record blood pressure readings at home and send them to me in one month. Educational material provided regarding diet.

## 2019-07-06 ENCOUNTER — Other Ambulatory Visit: Payer: Self-pay

## 2019-07-06 ENCOUNTER — Encounter: Payer: Self-pay | Admitting: Internal Medicine

## 2019-07-06 ENCOUNTER — Ambulatory Visit (INDEPENDENT_AMBULATORY_CARE_PROVIDER_SITE_OTHER): Payer: Medicare Other | Admitting: Internal Medicine

## 2019-07-06 VITALS — BP 166/86 | HR 91 | Temp 98.2°F | Wt 220.1 lb

## 2019-07-06 DIAGNOSIS — Z23 Encounter for immunization: Secondary | ICD-10-CM

## 2019-07-06 DIAGNOSIS — R42 Dizziness and giddiness: Secondary | ICD-10-CM

## 2019-07-06 DIAGNOSIS — Z79899 Other long term (current) drug therapy: Secondary | ICD-10-CM

## 2019-07-06 DIAGNOSIS — E1169 Type 2 diabetes mellitus with other specified complication: Secondary | ICD-10-CM | POA: Insufficient documentation

## 2019-07-06 DIAGNOSIS — F17200 Nicotine dependence, unspecified, uncomplicated: Secondary | ICD-10-CM

## 2019-07-06 DIAGNOSIS — I10 Essential (primary) hypertension: Secondary | ICD-10-CM

## 2019-07-06 DIAGNOSIS — R7303 Prediabetes: Secondary | ICD-10-CM

## 2019-07-06 DIAGNOSIS — E785 Hyperlipidemia, unspecified: Secondary | ICD-10-CM

## 2019-07-06 DIAGNOSIS — E119 Type 2 diabetes mellitus without complications: Secondary | ICD-10-CM | POA: Insufficient documentation

## 2019-07-06 DIAGNOSIS — Z0000 Encounter for general adult medical examination without abnormal findings: Secondary | ICD-10-CM

## 2019-07-06 DIAGNOSIS — Z720 Tobacco use: Secondary | ICD-10-CM

## 2019-07-06 LAB — CBC
HCT: 41.4 % (ref 39.0–52.0)
Hemoglobin: 13 g/dL (ref 13.0–17.0)
MCH: 23.9 pg — ABNORMAL LOW (ref 26.0–34.0)
MCHC: 31.4 g/dL (ref 30.0–36.0)
MCV: 76 fL — ABNORMAL LOW (ref 80.0–100.0)
Platelets: 248 10*3/uL (ref 150–400)
RBC: 5.45 MIL/uL (ref 4.22–5.81)
RDW: 17 % — ABNORMAL HIGH (ref 11.5–15.5)
WBC: 6.5 10*3/uL (ref 4.0–10.5)
nRBC: 0 % (ref 0.0–0.2)

## 2019-07-06 LAB — BASIC METABOLIC PANEL
Anion gap: 10 (ref 5–15)
BUN: 17 mg/dL (ref 8–23)
CO2: 24 mmol/L (ref 22–32)
Calcium: 9.5 mg/dL (ref 8.9–10.3)
Chloride: 107 mmol/L (ref 98–111)
Creatinine, Ser: 1.11 mg/dL (ref 0.61–1.24)
GFR calc Af Amer: >60 mL/min (ref >60)
GFR calc non Af Amer: >60 mL/min (ref >60)
Glucose, Bld: 84 mg/dL (ref 70–99)
Potassium: 4.4 mmol/L (ref 3.5–5.1)
Sodium: 141 mmol/L (ref 135–145)

## 2019-07-06 LAB — POCT GLYCOSYLATED HEMOGLOBIN (HGB A1C): Hemoglobin A1C: 6.5 % — AB (ref 4.0–5.6)

## 2019-07-06 LAB — LIPID PANEL
Cholesterol: 155 mg/dL (ref 0–200)
HDL: 41 mg/dL (ref >40)
LDL Cholesterol: 73 mg/dL (ref 0–99)
Total CHOL/HDL Ratio: 3.8 RATIO
Triglycerides: 207 mg/dL — ABNORMAL HIGH (ref <150)
VLDL: 41 mg/dL — ABNORMAL HIGH (ref 0–40)

## 2019-07-06 LAB — GLUCOSE, CAPILLARY: Glucose-Capillary: 79 mg/dL (ref 70–99)

## 2019-07-06 NOTE — Patient Instructions (Addendum)
It was a pleasure seeing you today. Your A1C (measure of diabetes) was up to 6.5 today. It was 5.9 last time we checked it. I would encourage you to work on your diet and exercise.  I would also like you to record your blood pressures for one month and then send me the results.  Please follow up in 3 months. Please feel free to reach out me sooner if anything comes up in the meantime. You can contact me via MyChart or calling the clinic.  Take care!

## 2019-07-06 NOTE — Assessment & Plan Note (Signed)
Anti-hyperglycemic agents: none Last A1C 5.9. A1C today 6.5  Asymptomatic at this time. Pt admits to poor diet over past few months. Decreased physical activity.  Discussed options moving forward including diet and exercise. Pt agrees to lifestyle management. Plan Follow up 3 mo with A1C recheck

## 2019-07-06 NOTE — Assessment & Plan Note (Signed)
Colonoscopy scheduled.  Received pneumonia and influenza vaccines today.  Will return for tetanus vaccine.

## 2019-07-06 NOTE — Assessment & Plan Note (Addendum)
Last see in July for this. Since then pt notes significant improvement in symptoms. Was primary positional in nature.  No further management at this time.

## 2019-07-06 NOTE — Progress Notes (Signed)
   CC: hypertension  HPI:  [name] is a 68 y.o. male who presents for hypertension and health maintenance. Please see problem based assessment and plan for additional details.     Past Medical History:  Diagnosis Date  . Hyperlipidemia   . Hypertension     Review of Systems:  Review of Systems - General ROS: negative for - fever Respiratory ROS: no cough, shortness of breath, or wheezing Cardiovascular ROS: no chest pain or dyspnea on exertion Gastrointestinal ROS: no abdominal pain, change in bowel habits, or black or bloody stools Genito-Urinary ROS: no dysuria, trouble voiding, or hematuria Neurological ROS: negative for - numbness/tingling   Physical Exam:  Vitals:   07/06/19 1449  BP: (!) 159/84  Pulse: 96  Temp: 98.2 F (36.8 C)  TempSrc: Oral  SpO2: 98%  Weight: 220 lb 1.6 oz (99.8 kg)    GENERAL: well appearing, in no apparent distress CARDIAC: heart regular rate and rhythm, no peripheral edema appreciated PULMONARY: lung sounds clear to auscultation ABDOMEN: bowel sounds active.  SKIN: no rash or lesion on limited exam NEURO: CN II-XII grossly intact   Assessment & Plan:   See Encounters Tab for problem based charting.  Pertinent labs & imaging results that were available during my care of the patient were reviewed by me and considered in my medical decision making  Patient is in agreement with the plan and endorses no further questions at this time.  Patient seen with Dr. Angelia Mould  Mitzi Hansen, MD Internal Medicine Resident-PGY1 07/06/19

## 2019-07-06 NOTE — Assessment & Plan Note (Addendum)
Medications: atorvastatin 40mg  Based on lipid panel from 2017, ASCVD 45.1%. No prior hx of CAD, MI or CVA. Denies chest pain, shortness of breath. Denies medication side effects. Plan Lipid panel today. Will consider increasing statin pending results.

## 2019-07-06 NOTE — Assessment & Plan Note (Signed)
Smoking 3-4 cigarettes per day. Patient not interested in cessation at this time. Risks of smoking reviewed.

## 2019-07-07 ENCOUNTER — Encounter: Payer: Self-pay | Admitting: *Deleted

## 2019-07-07 NOTE — Progress Notes (Signed)
Please call and let him know labs are unremarkable. No changes at this time. Recheck in 2-3 mo

## 2019-07-07 NOTE — Progress Notes (Signed)
MY Chart message sent to about.

## 2019-07-07 NOTE — Progress Notes (Signed)
Internal Medicine Clinic Attending  I saw and evaluated the patient.  I personally confirmed the key portions of the history and exam documented by Dr. Christian   and I reviewed pertinent patient test results.  The assessment, diagnosis, and plan were formulated together and I agree with the documentation in the resident's note.  

## 2019-07-14 DIAGNOSIS — E782 Mixed hyperlipidemia: Secondary | ICD-10-CM | POA: Diagnosis not present

## 2019-07-14 DIAGNOSIS — Z1211 Encounter for screening for malignant neoplasm of colon: Secondary | ICD-10-CM | POA: Diagnosis not present

## 2019-07-14 DIAGNOSIS — I10 Essential (primary) hypertension: Secondary | ICD-10-CM | POA: Diagnosis not present

## 2019-10-05 ENCOUNTER — Encounter: Payer: Self-pay | Admitting: Internal Medicine

## 2019-10-05 ENCOUNTER — Other Ambulatory Visit: Payer: Self-pay

## 2019-10-05 ENCOUNTER — Ambulatory Visit (INDEPENDENT_AMBULATORY_CARE_PROVIDER_SITE_OTHER): Payer: Medicare Other | Admitting: Internal Medicine

## 2019-10-05 VITALS — BP 154/93 | HR 86 | Temp 98.0°F | Ht 68.0 in | Wt 221.8 lb

## 2019-10-05 DIAGNOSIS — Z720 Tobacco use: Secondary | ICD-10-CM | POA: Diagnosis not present

## 2019-10-05 DIAGNOSIS — I10 Essential (primary) hypertension: Secondary | ICD-10-CM | POA: Diagnosis not present

## 2019-10-05 DIAGNOSIS — E785 Hyperlipidemia, unspecified: Secondary | ICD-10-CM

## 2019-10-05 DIAGNOSIS — Z79899 Other long term (current) drug therapy: Secondary | ICD-10-CM

## 2019-10-05 DIAGNOSIS — R7303 Prediabetes: Secondary | ICD-10-CM

## 2019-10-05 LAB — GLUCOSE, CAPILLARY: Glucose-Capillary: 106 mg/dL — ABNORMAL HIGH (ref 70–99)

## 2019-10-05 LAB — POCT GLYCOSYLATED HEMOGLOBIN (HGB A1C): Hemoglobin A1C: 6.4 % — AB (ref 4.0–5.6)

## 2019-10-05 NOTE — Assessment & Plan Note (Signed)
Patient with hyperlipidemia. On atorvastatin 40 mg once daily. His LDL has had a 50% reduction since starting this medication. He continues to have an ASCVD risk score of approximately 40%. Discussed smoking cessation and better treatment of his hypertension.

## 2019-10-05 NOTE — Assessment & Plan Note (Signed)
Patient with uncontrolled hypertension. He is gained approximately 20 pounds over the last year. He has been compliant with his amlodipine 5 mg once daily and olmesartan 40 mg once daily. Any adverse effects from this medication. He states that he was previously on a third medication however was discontinued after he had in your syncopal episode. Upon review of records that medication was hydrochlorothiazide.  He does continue to smoke three cigarettes per day. It does not drink daily. He does consume significant amount of caffeine per day. He does not exercise.  A/P: - Discussed why [facility] hypertension advise starting a third agent. He is reluctant. He would like to try lifestyle modification. He will work on modifications to his diet and start exercising more. - Discussed the benefits of weight loss. - Continue amlodipine 5 mg once daily and olmesartan 40 mg once daily

## 2019-10-05 NOTE — Progress Notes (Signed)
   CC: Prediabetes, HTN, HLD  HPI:  [name] is a 69 y.o. male with PMHx listed below presenting for Prediabetes, HTN, HLD. Please see the A&P for the status of the patient's chronic medical problems.  Past Medical History:  Diagnosis Date  . Hyperlipidemia   . Hypertension   . Multiple facial fractures, open, initial encounter ([place]) 05/30/2018   Review of Systems:  Performed and all others negative.  Physical Exam: Vitals:   10/05/19 1029  BP: (!) 157/90  Pulse: 90  SpO2: 99%  Weight: 221 lb 12.8 oz (100.6 kg)   General: Obese male in no acute distress Pulm: Good air movement with no wheezing or crackles  CV: RRR, no murmurs, no rubs   Assessment & Plan:   See Encounters Tab for problem based charting.  Patient discussed with Dr. Daryll Drown

## 2019-10-05 NOTE — Patient Instructions (Signed)
Thank you for allowing [place] to provide your care. I understand your reluctance he to start another medication. We treat high blood pressure and high blood sugars to decrease the chance of strokes, heart attacks, and damage to your kidneys.  I have referred you for dietary counseling. Try to start doing 20 to 30 minutes of exercise per day. Weight loss will be highly beneficial for your blood pressure and your high blood sugars. Try to cut out smoking. If you decide you need assistance with this please call to let us know.  I would like you to come back to see her primary care provider in 4 to 8 weeks. Please call if you have any questions or concerns.

## 2019-10-05 NOTE — Assessment & Plan Note (Addendum)
Patient with prediabetes. Last A1c 6.5. We will repeat A1c today. He is gained 20 pounds over the past year. He does not exercise. He is not modified his diet. He drinks 2 to 3 regular sodas per day in addition to flavored water. He is reluctant to start any medications.  A/P: - A1c 6.4  - Discussed lifestyle modification  -

## 2019-10-09 NOTE — Progress Notes (Signed)
Internal Medicine Clinic Attending  Case discussed with Dr. Helberg  soon after the resident saw the patient.  We reviewed the resident's history and exam and pertinent patient test results.  I agree with the assessment, diagnosis, and plan of care documented in the resident's note.  

## 2019-11-07 ENCOUNTER — Ambulatory Visit: Payer: Medicare Other | Attending: Internal Medicine

## 2019-11-07 DIAGNOSIS — Z23 Encounter for immunization: Secondary | ICD-10-CM

## 2019-11-07 NOTE — Progress Notes (Signed)
   Covid-19 Vaccination Clinic  Name:  LINDBURGH BLUEMEL    MRN: [id number] DOB: 13-Feb-1951  11/07/2019  Mr. Rao was observed post Covid-19 immunization for 15 minutes without incidence. He was provided with Vaccine Information Sheet and instruction to access the V-Safe system.   Mr. Slider was instructed to call 911 with any severe reactions post vaccine: Marland Kitchen Difficulty breathing  . Swelling of your face and throat  . A fast heartbeat  . A bad rash all over your body  . Dizziness and weakness    Immunizations Administered    Name Date Dose VIS Date Route   Pfizer COVID-19 Vaccine 11/07/2019 11:43 AM 0.3 mL 08/27/2019 Intramuscular   Manufacturer: [place]   Lot: [id number]   [place]: [id number]

## 2019-11-17 NOTE — Progress Notes (Signed)
   CC: chronic essential hypertension, chronic nicotine dependence, prediabetes  HPI:  Samuel Weiss is a 69 y.o. male who presents for management of his chronic hypertension, nicotine dependence and prediabetes. Please see problem based assessment and plan for additional details.   Past Medical History:  Diagnosis Date  . Hyperlipidemia   . Hypertension   . Multiple facial fractures, open, initial encounter ([place]) 05/30/2018    Review of Systems:  Review of Systems - General ROS: negative for - chills or fever Respiratory ROS: no cough, shortness of breath, or wheezing Cardiovascular ROS: no chest pain or dyspnea on exertion Gastrointestinal ROS: no abdominal pain, change in bowel habits, or black or bloody stools   Physical Exam:  Vitals:   11/18/19 1328  BP: (!) 154/92  Pulse: 93  Temp: 98.4 F (36.9 C)  TempSrc: Oral  SpO2: 99%  Weight: 222 lb 3.2 oz (100.8 kg)    GENERAL: well appearing, in no apparent distress HEENT: no conjunctival injection. Nares patent.  CARDIAC: heart regular rate and rhythm, no peripheral edema appreciated PULMONARY: lung sounds clear to auscultation ABDOMEN: bowel sounds active.  SKIN: no rash or lesion on limited exam NEURO: CN II-XII grossly intact   Assessment & Plan:   Essential Hypertension Medications: amlodipine 5mg , olmasartan 40mg   Last seen in the office 1/19 and found to have fairly elevated blood pressure. Provider discussed escalating his treating regimen at that time however Adib wished to attempt some lifestyle modifications first.  He is presenting today for a recheck on his blood pressure. Reports good compliance with his medication. Denies lightheadedness or orthostasis. Blood pressure in the office today remains elevated. Hyperlipidemia Medications: atorvastatin 40mg  Last lipid panel 06/2019 indicated good control.  Assessment: poorly controlled hypertension. ASCVD 38%.We discussed long term effects of   Hypertension and ways to reduce his risk. He was somewhat hesitant to increase his antihypertensive due to his fall in 2019 that resulted in facial fractures wihich he attributes to being hypotensive. On chart review , it appears he was on olmasartan-amlodipine-hctz. We discussed that we can start by just increasing his current dose of amlodipine-olmasartan to 10-40mg .  Plan Increase amlodipine-olmasartan to 10-40mg . F/u in 4w  Continue high intensity statin 40mg  lipitor  Prediabetes Last A1C 6.4 in Jan 2020. Encouraged lifestyle changes. Will recheck at his next office visit in 4w.  Tobacco use Discussed at prior visit last October at which time he was not ready to quit. Notes that he smokes less than a pack per week. We discussed how this can still have detrimental effects however he is not ready to cut down further however he has no interest in cessation at this time.  Patient is in agreement with the plan and endorses no further questions at this time.  Patient discussed with Dr. Adolm Joseph, MD Internal Medicine Resident-PGY1 11/18/19

## 2019-11-18 ENCOUNTER — Ambulatory Visit (INDEPENDENT_AMBULATORY_CARE_PROVIDER_SITE_OTHER): Payer: Medicare Other | Admitting: Internal Medicine

## 2019-11-18 ENCOUNTER — Other Ambulatory Visit: Payer: Self-pay

## 2019-11-18 ENCOUNTER — Encounter: Payer: Self-pay | Admitting: Internal Medicine

## 2019-11-18 DIAGNOSIS — Z79899 Other long term (current) drug therapy: Secondary | ICD-10-CM

## 2019-11-18 DIAGNOSIS — I10 Essential (primary) hypertension: Secondary | ICD-10-CM

## 2019-11-18 DIAGNOSIS — R7303 Prediabetes: Secondary | ICD-10-CM

## 2019-11-18 DIAGNOSIS — Z720 Tobacco use: Secondary | ICD-10-CM | POA: Diagnosis not present

## 2019-11-18 DIAGNOSIS — F17200 Nicotine dependence, unspecified, uncomplicated: Secondary | ICD-10-CM

## 2019-11-18 DIAGNOSIS — E785 Hyperlipidemia, unspecified: Secondary | ICD-10-CM | POA: Diagnosis not present

## 2019-11-18 MED ORDER — AMLODIPINE-OLMESARTAN 10-40 MG PO TABS: 1.0000 | ORAL_TABLET | Freq: Every day | ORAL | 3 refills | Status: DC

## 2019-11-18 NOTE — Patient Instructions (Signed)
It was nice seeing you again today. Since your blood pressure is still elevated today, I would like to try to increase you blood pressure medicine a little. If you start to become light headed again like you have in the past, please go back to taking your current dose and let me know. I would like you to return in about 4 weeks so we can recheck your blood pressure and we can also recheck your A1C at that time. Continue to try to be active as this will also help your blood pressure and reduce your overall cardiovascular risk.

## 2019-11-20 NOTE — Assessment & Plan Note (Signed)
Tobacco use Discussed at prior visit last October at which time he was not ready to quit. Notes that he smokes less than a pack per week. Counseled on cessation. We discussed how this can still have detrimental effects however he is not ready to cut down further however he has no interest in cessation at this time.

## 2019-11-20 NOTE — Assessment & Plan Note (Signed)
Essential Hypertension Medications: amlodipine 5mg , olmasartan 40mg   Last seen in the office 1/19 and found to have fairly elevated blood pressure. Provider discussed escalating his treating regimen at that time however Audry wished to attempt some lifestyle modifications first.  He is presenting today for a recheck on his blood pressure. Reports good compliance with his medication. Denies lightheadedness or orthostasis. Blood pressure in the office today remains elevated. Assessment: poorly controlled hypertension. ASCVD 38%.We discussed long term effects of  Hypertension and ways to reduce his risk. He was somewhat hesitant to increase his antihypertensive due to his fall in 2019 that resulted in facial fractures wihich he attributes to being hypotensive. On chart review , it appears he was on olmasartan-amlodipine-hctz. We discussed that we can start by just increasing his current dose of amlodipine-olmasartan to 10-40mg  Plan Increase amlodipine-olmasartan to 10-40mg . F/u in 4w

## 2019-11-20 NOTE — Assessment & Plan Note (Signed)
Hyperlipidemia Medications: atorvastatin 40mg  Last lipid panel 06/2019 indicated good control.   ASCVD 38% Plan Continue high intensity statin 40mg  lipitor

## 2019-11-30 ENCOUNTER — Other Ambulatory Visit: Payer: Self-pay | Admitting: Internal Medicine

## 2019-11-30 DIAGNOSIS — E785 Hyperlipidemia, unspecified: Secondary | ICD-10-CM

## 2019-12-01 ENCOUNTER — Ambulatory Visit: Payer: Medicare Other | Attending: Internal Medicine

## 2019-12-01 DIAGNOSIS — Z23 Encounter for immunization: Secondary | ICD-10-CM

## 2019-12-01 NOTE — Progress Notes (Signed)
   Covid-19 Vaccination Clinic  Name:  THAO DAFFERN    MRN: [id number] DOB: Nov 14, 1950  12/01/2019  Mr. Debaere was observed post Covid-19 immunization for 15 minutes without incident. He was provided with Vaccine Information Sheet and instruction to access the V-Safe system.   Mr. Tippie was instructed to call 911 with any severe reactions post vaccine: Marland Kitchen Difficulty breathing  . Swelling of face and throat  . A fast heartbeat  . A bad rash all over body  . Dizziness and weakness   Immunizations Administered    Name Date Dose VIS Date Route   Pfizer COVID-19 Vaccine 12/01/2019 10:44 AM 0.3 mL 08/27/2019 Intramuscular   Manufacturer: [place]   Lot: [id number]   [place]: [id number]

## 2019-12-03 NOTE — Progress Notes (Signed)
Internal Medicine Clinic Attending  Case discussed with Dr. Christian at the time of the visit.  We reviewed the resident's history and exam and pertinent patient test results.  I agree with the assessment, diagnosis, and plan of care documented in the resident's note.    

## 2019-12-16 ENCOUNTER — Ambulatory Visit: Payer: Medicare Other

## 2020-02-01 ENCOUNTER — Encounter: Payer: Self-pay | Admitting: Internal Medicine

## 2020-02-01 ENCOUNTER — Ambulatory Visit (INDEPENDENT_AMBULATORY_CARE_PROVIDER_SITE_OTHER): Payer: Medicare Other | Admitting: Internal Medicine

## 2020-02-01 VITALS — BP 152/79 | HR 83 | Temp 98.2°F | Ht 68.0 in | Wt 221.3 lb

## 2020-02-01 DIAGNOSIS — R7303 Prediabetes: Secondary | ICD-10-CM

## 2020-02-01 DIAGNOSIS — I10 Essential (primary) hypertension: Secondary | ICD-10-CM | POA: Diagnosis not present

## 2020-02-01 LAB — POCT GLYCOSYLATED HEMOGLOBIN (HGB A1C): Hemoglobin A1C: 6.2 % — AB (ref 4.0–5.6)

## 2020-02-01 LAB — GLUCOSE, CAPILLARY: Glucose-Capillary: 97 mg/dL (ref 70–99)

## 2020-02-01 NOTE — Progress Notes (Signed)
   CC: hypertension f/u  HPI:  [name] is a 69 y.o. male with PMHx of hypertension and hyperlipidemia presenting for hypertension f/u. No acute concerns today. Please see encounters tab for complete assessment and plan.  Past Medical History:  Diagnosis Date  . Hyperlipidemia   . Hypertension   . Multiple facial fractures, open, initial encounter ([place]) 05/30/2018   Review of Systems:  Negative except as stated in HPI.   Physical Exam:  Vitals:   02/01/20 1551  BP: (!) 152/79  Pulse: 83  Temp: 98.2 F (36.8 C)  TempSrc: Oral  SpO2: 92%  Weight: 221 lb 4.8 oz (100.4 kg)  Height: 5\' 8"  (1.727 m)   Physical Exam Vitals reviewed.  Constitutional:      General: He is not in acute distress.    Appearance: Normal appearance. He is not ill-appearing.  Cardiovascular:     Rate and Rhythm: Normal rate and regular rhythm.     Pulses: Normal pulses.     Heart sounds: Normal heart sounds. No murmur. No friction rub. No gallop.   Pulmonary:     Effort: Pulmonary effort is normal. No respiratory distress.     Breath sounds: Normal breath sounds. No wheezing, rhonchi or rales.  Musculoskeletal:        General: Normal range of motion.  Skin:    General: Skin is warm and dry.     Capillary Refill: Capillary refill takes less than 2 seconds.  Neurological:     General: No focal deficit present.     Mental Status: He is alert and oriented to person, place, and time. Mental status is at baseline.     Sensory: No sensory deficit.     Motor: No weakness.      Assessment & Plan:   See Encounters Tab for problem based charting.  Patient discussed with Dr. Lynnae January

## 2020-02-01 NOTE — Patient Instructions (Addendum)
Samuel Weiss,   It was a pleasure seeing you in clinic. Today we discussed:   Hypertension: Please continue to take your blood pressure medication as prescribed. I would recommend a low sodium diet with exercise (at least 30 minutes daily). I will place a referral to our nutritionist. Please cut down on your alcohol intake as this will also help with your blood pressure.   Prediabetes: I will check your A1c level today. Please continue to cut down on your sugar intake.   Please contact us if you have any questions or concerns.  Thank you!

## 2020-02-02 NOTE — Assessment & Plan Note (Addendum)
Hemoglobin A1c 6.2 today.  Recommended for lifestyle modification.  Patient interested in discussing with nutritionist  Plan -Lifestyle modification -Referral to nutrition -A1c in 3 months

## 2020-02-02 NOTE — Assessment & Plan Note (Signed)
BP 152/79 at this visit. Patient was last evaluated in office on 11/20/2019 and noted to have continued elevated blood pressures. Patient's amlodipine-olmesartan dosage was increased. Patient continues to have elevated pressure with SBP >150.  Patient hesitant to increase his antihypertensive regimen at this time due to prior history of syncopal episode secondary to hypotension.  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  Discussed lifestyle modifications at this time for which patient is agreeable.  Plan -Continue amlodipine-olmesartan 10-40 mg daily -Lifestyle modifications encouraged -Follow-up in 4 weeks

## 2020-02-07 NOTE — Progress Notes (Signed)
Internal Medicine Clinic Attending  Case discussed with Dr. Aslam at the time of the visit.  We reviewed the resident's history and exam and pertinent patient test results.  I agree with the assessment, diagnosis, and plan of care documented in the resident's note.  

## 2020-02-09 ENCOUNTER — Telehealth: Payer: Self-pay | Admitting: Dietician

## 2020-02-09 NOTE — Telephone Encounter (Signed)
Samuel Weiss was referred to me for prediabetes. For prediabetes Medicare only covers the prediabetes programs. Called Samuel Weiss about attending/being referred to the [place] virtual  Medicare Diabetes Prevention Program. He is interested in attending in person when/if they go back to in person meetings but not the virtual classes. In the mean time, he agreed to allow me to mail him a handout on how to prevent diabetes along with my contact information for questions.

## 2020-02-18 ENCOUNTER — Other Ambulatory Visit: Payer: Self-pay

## 2020-02-18 ENCOUNTER — Ambulatory Visit: Payer: Medicare Other | Admitting: Podiatry

## 2020-02-18 VITALS — Temp 97.3°F

## 2020-02-18 DIAGNOSIS — B351 Tinea unguium: Secondary | ICD-10-CM

## 2020-02-18 DIAGNOSIS — M79609 Pain in unspecified limb: Secondary | ICD-10-CM

## 2020-02-18 DIAGNOSIS — Q828 Other specified congenital malformations of skin: Secondary | ICD-10-CM | POA: Diagnosis not present

## 2020-02-18 DIAGNOSIS — M79676 Pain in unspecified toe(s): Secondary | ICD-10-CM | POA: Diagnosis not present

## 2020-03-20 NOTE — Progress Notes (Signed)
  Subjective:  Patient ID: Samuel Weiss, male    DOB: Jan 13, 1951,  MRN: [id number]  Chief Complaint  Patient presents with  . Skin Problem    Painful lesions - calluses per pt. Bilateral plantar midfoot, lateral aspect. Pt stated, "I've had them for 3-4 yrs. I was going to the [facility] - didn't get an appt in the past yr because of the pandemic. The calluses became painful, but I couldn't get an appt with the doctor. She cuts the core out of the calluses, but I have to go back every month".  . Nail Problem    Thick, discolored, long toenails. Requests nail trim.    69 y.o. male presents with the above complaint. History confirmed with patient.   Objective:  Physical Exam: warm, good capillary refill, nail exam onychomycosis of the toenails, no trophic changes or ulcerative lesions. DP pulses palpable, PT pulses palpable and protective sensation intact. Small punctate keratoses bilat Left Foot: normal exam, no swelling, tenderness, instability; ligaments intact, full range of motion of all ankle/foot joints  Right Foot: normal exam, no swelling, tenderness, instability; ligaments intact, full range of motion of all ankle/foot joints   No images are attached to the encounter.  Assessment:   1. Pain due to onychomycosis of nail   2. Porokeratosis      Plan:  Patient was evaluated and treated and all questions answered.  Onychomycosis -Nails palliatively debrided secondary to pain -Advised he likely does not meet criteria for routine foot care -Follow-up as needed  Procedure: Nail Debridement Rationale: Pain Type of Debridement: manual, sharp debridement. Instrumentation: Nail nipper, rotary burr. Number of Nails: 10   Procedure: Paring of Lesion Rationale: painful hyperkeratotic lesion Type of Debridement: manual, sharp debridement. Instrumentation: 312 blade Number of Lesions: 3  Return if symptoms worsen or fail to improve.

## 2020-05-24 ENCOUNTER — Other Ambulatory Visit: Payer: Self-pay | Admitting: Internal Medicine

## 2020-05-24 DIAGNOSIS — E785 Hyperlipidemia, unspecified: Secondary | ICD-10-CM

## 2020-05-29 ENCOUNTER — Other Ambulatory Visit: Payer: Self-pay

## 2020-05-29 ENCOUNTER — Ambulatory Visit (INDEPENDENT_AMBULATORY_CARE_PROVIDER_SITE_OTHER): Payer: Medicare Other | Admitting: Internal Medicine

## 2020-05-29 ENCOUNTER — Encounter: Payer: Self-pay | Admitting: Internal Medicine

## 2020-05-29 VITALS — BP 156/84 | HR 79 | Temp 98.0°F | Ht 68.0 in | Wt 218.1 lb

## 2020-05-29 DIAGNOSIS — Z0001 Encounter for general adult medical examination with abnormal findings: Secondary | ICD-10-CM

## 2020-05-29 DIAGNOSIS — I10 Essential (primary) hypertension: Secondary | ICD-10-CM | POA: Diagnosis not present

## 2020-05-29 DIAGNOSIS — Z1211 Encounter for screening for malignant neoplasm of colon: Secondary | ICD-10-CM | POA: Diagnosis not present

## 2020-05-29 DIAGNOSIS — Z23 Encounter for immunization: Secondary | ICD-10-CM

## 2020-05-29 DIAGNOSIS — Z0000 Encounter for general adult medical examination without abnormal findings: Secondary | ICD-10-CM

## 2020-05-29 DIAGNOSIS — R7303 Prediabetes: Secondary | ICD-10-CM

## 2020-05-29 LAB — GLUCOSE, CAPILLARY: Glucose-Capillary: 85 mg/dL (ref 70–99)

## 2020-05-29 LAB — POCT GLYCOSYLATED HEMOGLOBIN (HGB A1C): Hemoglobin A1C: 6.4 % — AB (ref 4.0–5.6)

## 2020-05-29 MED ORDER — HYDROCHLOROTHIAZIDE 12.5 MG PO CAPS: 12.5000 mg | ORAL_CAPSULE | Freq: Every day | ORAL | 2 refills | Status: DC

## 2020-05-29 NOTE — Patient Instructions (Signed)
Your blood pressure has remained higher than we would like to see it. I have sent a new medication into your pharmacy. Please come back in 4 weeks so we can recheck your blood pressure.

## 2020-05-30 LAB — BASIC METABOLIC PANEL
BUN/Creatinine Ratio: 15 (ref 10–24)
BUN: 14 mg/dL (ref 8–27)
CO2: 22 mmol/L (ref 20–29)
Calcium: 9.6 mg/dL (ref 8.6–10.2)
Chloride: 104 mmol/L (ref 96–106)
Creatinine, Ser: 0.91 mg/dL (ref 0.76–1.27)
GFR calc Af Amer: 98 mL/min/{1.73_m2} (ref >59)
GFR calc non Af Amer: 84 mL/min/{1.73_m2} (ref >59)
Glucose: 85 mg/dL (ref 65–99)
Potassium: 3.9 mmol/L (ref 3.5–5.2)
Sodium: 142 mmol/L (ref 134–144)

## 2020-05-30 NOTE — Progress Notes (Signed)
Office Visit   Patient ID: Samuel Weiss, male    DOB: 1950-11-11, 69 y.o.   MRN: [id number]  Subjective:  CC: hypertension follow up  HPI 69 y.o. presents today for follow up of his chronic and poorly controlled hypertension, prediabetes and health screening.  HYPERTENSION FOLLOW-UP: Current medications: amlodipine-olmesartan 10-40mg  daily Have you taken blood pressure medication(s) today: yes Med Adherence: [x]  Yes    []  No Medication side effects: []  Yes    [x]  No Adherence with salt restriction: [x]  Yes    []  No Exercise: Yes []  No [x]  Home Monitoring?: []  Yes    [x]  No Smoking []  Yes [x]  No SOB? []  Yes [x]  No Chest Pain?: []  Yes    [x]  No Leg swelling?: []  Yes [x]  No Headaches?: []  Yes    [x]  No Dizziness? []  Yes    [x]  No       ACTIVE MEDICATIONS   Current Outpatient Medications on File Prior to Visit  Medication Sig Dispense Refill  . amLODipine-olmesartan (AZOR) 10-40 MG tablet Take 1 tablet by mouth daily. 90 tablet 3  . atorvastatin (LIPITOR) 40 MG tablet TAKE 1 TABLET BY MOUTH EVERY DAY 90 tablet 1  . [DISCONTINUED] atorvastatin (LIPITOR) 40 MG tablet Take 1 tablet (40 mg total) by mouth daily. 90 tablet 1   No current facility-administered medications on file prior to visit.    ROS  Review of Systems  Constitutional: Negative for fatigue and fever.  Respiratory: Negative for shortness of breath.   Cardiovascular: Negative for chest pain and leg swelling.  Neurological: Negative for dizziness and light-headedness.    Objective:   BP (!) 156/84   Pulse 79   Temp 98 F (36.7 C)   Ht 5\' 8"  (1.727 m)   Wt 218 lb 1.6 oz (98.9 kg)   SpO2 99% Comment: room air  BMI 33.16 kg/m  Wt Readings from Last 3 Encounters:  05/29/20 218 lb 1.6 oz (98.9 kg)  02/01/20 221 lb 4.8 oz (100.4 kg)  11/18/19 222 lb 3.2 oz (100.8 kg)   BP Readings from Last 3 Encounters:  05/29/20 (!) 156/84  02/01/20 (!) 152/79  11/18/19 (!) 154/92   Physical  Exam Constitutional:      Appearance: Normal appearance.  Cardiovascular:     Rate and Rhythm: Normal rate and regular rhythm.  Pulmonary:     Effort: Pulmonary effort is normal.     Breath sounds: Normal breath sounds.     Health Maintenance:   Health Maintenance  Topic Date Due  . TETANUS/TDAP  Never done  . COLONOSCOPY  Never done  . PNA vac Low Risk Adult (2 of 2 - PPSV23) 07/05/2020  . INFLUENZA VACCINE  Completed  . COVID-19 Vaccine  Completed  . Hepatitis C Screening  Completed     Assessment & Plan:   Problem List Items Addressed This Visit      Cardiovascular and Mediastinum   Essential hypertension (Chronic)    Blood pressure is above goal at his visit today.  BP Readings from Last 3 Encounters:  05/29/20 (!) 156/84  02/01/20 (!) 152/79  11/18/19 (!) 154/92    Chart review shows this has been noted on his last few visits. We had discussed escalating his regimen last time I saw him however he was hesitant because of a prior history of falling due to a syncopal event 2/2 dehydration while on a diuretic.  Today is agreeable to starting a 3rd medication. Labs checked and wnl.  Plan  --start hctz 12.5mg  daily --continue amlodipine-olmasartan 10-40mg  daily --follow up for blood pressure recheck and repeat BMP in 2-4w      Relevant Medications   hydrochlorothiazide (MICROZIDE) 12.5 MG capsule   Other Relevant Orders   Basic metabolic panel (Completed)     Other   Healthcare maintenance (Chronic)    Patient was unable to make it to a prior appt for colonoscopy. Referral to GI for this reordered at today's visit.      Prediabetes    A1C 6.4 today. 6.1 on last check. Discussed lifestyle modifications. Patient would like to avoid pharmaceutical therapy if possible. Plan --repeat A1C in 6 months      Relevant Orders   POC Hbg A1C (Completed)    Other Visit Diagnoses    Colon cancer screening    -  Primary   Relevant Orders   Ambulatory referral to  Gastroenterology   Need for immunization against influenza       Relevant Orders   Flu Vaccine QUAD 36+ mos IM (Completed)        Pt discussed with Dr. Clista Bernhardt, MD Internal Medicine Resident PGY-2 [facility] Pager: [phone] 05/30/2020 4:14 PM

## 2020-05-30 NOTE — Assessment & Plan Note (Signed)
A1C 6.4 today. 6.1 on last check. Discussed lifestyle modifications. Patient would like to avoid pharmaceutical therapy if possible. Plan --repeat A1C in 6 months

## 2020-05-30 NOTE — Assessment & Plan Note (Signed)
Blood pressure is above goal at his visit today.  BP Readings from Last 3 Encounters:  05/29/20 (!) 156/84  02/01/20 (!) 152/79  11/18/19 (!) 154/92    Chart review shows this has been noted on his last few visits. We had discussed escalating his regimen last time I saw him however he was hesitant because of a prior history of falling due to a syncopal event 2/2 dehydration while on a diuretic.  Today is agreeable to starting a 3rd medication. Labs checked and wnl. Plan  --start hctz 12.5mg  daily --continue amlodipine-olmasartan 10-40mg  daily --follow up for blood pressure recheck and repeat BMP in 2-4w

## 2020-05-30 NOTE — Assessment & Plan Note (Signed)
Patient was unable to make it to a prior appt for colonoscopy. Referral to GI for this reordered at today's visit.

## 2020-05-31 NOTE — Progress Notes (Signed)
Internal Medicine Clinic Attending  Case discussed with Dr. Christian  At the time of the visit.  We reviewed the resident's history and exam and pertinent patient test results.  I agree with the assessment, diagnosis, and plan of care documented in the resident's note.  

## 2020-06-13 ENCOUNTER — Other Ambulatory Visit: Payer: Self-pay

## 2020-06-13 ENCOUNTER — Ambulatory Visit (INDEPENDENT_AMBULATORY_CARE_PROVIDER_SITE_OTHER): Payer: Medicare Other | Admitting: Podiatry

## 2020-06-13 DIAGNOSIS — B351 Tinea unguium: Secondary | ICD-10-CM | POA: Diagnosis not present

## 2020-06-13 DIAGNOSIS — L84 Corns and callosities: Secondary | ICD-10-CM

## 2020-06-14 NOTE — Progress Notes (Signed)
  Subjective:  Patient ID: Samuel Weiss, male    DOB: [birth year],  MRN: [id number]  Chief Complaint  Patient presents with  . Callouses    Bilateral plantar callous trim  . Nail Problem    Nail trim 1-5 bilateral    69 y.o. male presents with the above complaint. History confirmed with patient.   Objective:  Physical Exam: warm, good capillary refill, nail exam onychomycosis of the toenails, no trophic changes or ulcerative lesions. DP pulses palpable, PT pulses palpable and protective sensation intact. Small punctate keratoses bilat Left Foot: normal exam, no swelling, tenderness, instability; ligaments intact, full range of motion of all ankle/foot joints  Right Foot: normal exam, no swelling, tenderness, instability; ligaments intact, full range of motion of all ankle/foot joints   No images are attached to the encounter.  Assessment:   1. Onychomycosis   2. Callus      Plan:  Patient was evaluated and treated and all questions answered.  Onychomycosis -Advised he does not meet criteria for routine care. Patient wished to proceed.   Procedure: Nail Debridement Type of Debridement: manual, sharp debridement. Instrumentation: Nail nipper, rotary burr. Number of Nails: 10   Procedure: Paring of Lesion Rationale: painful hyperkeratotic lesion Type of Debridement: manual, sharp debridement. Instrumentation: 312 blade Number of Lesions: 3    No follow-ups on file.

## 2020-06-20 DIAGNOSIS — D122 Benign neoplasm of ascending colon: Secondary | ICD-10-CM | POA: Diagnosis not present

## 2020-06-20 DIAGNOSIS — D125 Benign neoplasm of sigmoid colon: Secondary | ICD-10-CM | POA: Diagnosis not present

## 2020-06-20 DIAGNOSIS — Z1211 Encounter for screening for malignant neoplasm of colon: Secondary | ICD-10-CM | POA: Diagnosis not present

## 2020-06-20 DIAGNOSIS — D123 Benign neoplasm of transverse colon: Secondary | ICD-10-CM | POA: Diagnosis not present

## 2020-06-20 DIAGNOSIS — K635 Polyp of colon: Secondary | ICD-10-CM | POA: Diagnosis not present

## 2020-08-23 ENCOUNTER — Encounter: Payer: Self-pay | Admitting: Internal Medicine

## 2020-08-23 ENCOUNTER — Other Ambulatory Visit: Payer: Self-pay

## 2020-08-23 ENCOUNTER — Encounter: Payer: Self-pay | Admitting: *Deleted

## 2020-08-23 ENCOUNTER — Ambulatory Visit (INDEPENDENT_AMBULATORY_CARE_PROVIDER_SITE_OTHER): Payer: Medicare Other | Admitting: Student

## 2020-08-23 VITALS — BP 136/82 | HR 93 | Temp 98.2°F | Ht 68.0 in | Wt 217.3 lb

## 2020-08-23 DIAGNOSIS — R040 Epistaxis: Secondary | ICD-10-CM | POA: Diagnosis not present

## 2020-08-23 MED ORDER — CLASSIC NETI POT SINUS WASH 2300-700 MG NA KIT: 1.0000 "application " | PACK | NASAL | 0 refills | Status: DC | PRN

## 2020-08-23 MED ORDER — AFRIN NASAL SPRAY 0.05 % NA SOLN: 1.0000 | Freq: Two times a day (BID) | NASAL | 0 refills | Status: DC

## 2020-08-23 NOTE — Progress Notes (Unsigned)
Things That May Be Affecting Your Health:  Alcohol  Hearing loss  Pain    Depression  Home Safety  Sexual Health   Diabetes  Lack of physical activity  Stress   Difficulty with daily activities  Loneliness  Tiredness   Drug use  Medicines  Tobacco use   Falls  Motor Vehicle Safety  Weight   Food choices  Oral Health  Other    YOUR PERSONALIZED HEALTH PLAN : 1. Schedule your next subsequent Medicare Wellness visit in one year 2. Attend all of your regular appointments to address your medical issues 3. Complete the preventative screenings and services   Annual Wellness Visit   Medicare Covered Preventative Screenings and [place] Men and Women Who How Often Need? Date of Last Service Action  Abdominal Aortic Aneurysm Adults with AAA risk factors Once     Alcohol Misuse and Counseling All Adults Screening once a year if no alcohol misuse. Counseling up to 4 face to face sessions.     Bone Density Measurement  Adults at risk for osteoporosis Once every 2 yrs     Lipid Panel Z13.6 All adults without CV disease Once every 5 yrs     Colorectal Cancer   Stool sample or  Colonoscopy All adults 37 and older   Once every year  Every 10 years X    Depression All Adults Once a year X Today   Diabetes Screening Blood glucose, post glucose load, or GTT Z13.1  All adults at risk  Pre-diabetics  Once per year  Twice per year     Diabetes  Self-Management Training All adults Diabetics 10 hrs first year; 2 hours subsequent years. Requires Copay     Glaucoma  Diabetics  Family history of glaucoma  African Americans 42 yrs +  Hispanic Americans 88 yrs + Annually - requires coppay X    Hepatitis C Z72.89 or F19.20  High Risk for HCV  Born between 1945 and 1965  Annually  Once     HIV Z11.4 All adults based on risk  Annually btw ages 23 & 26 regardless of risk  Annually > 65 yrs if at increased risk     Lung Cancer Screening Asymptomatic adults aged  62-77 with 30 pack yr history and current smoker OR quit within the last 15 yrs Annually Must have counseling and shared decision making documentation before first screen     Medical Nutrition Therapy Adults with   Diabetes  Renal disease  Kidney transplant within past 3 yrs 3 hours first year; 2 hours subsequent years     Obesity and Counseling All adults Screening once a year Counseling if BMI 30 or higher  Today   Tobacco Use Counseling Adults who use tobacco  Up to 8 visits in one year     Vaccines Z23  Hepatitis B  Influenza   Pneumonia  Adults   Once  Once every flu season  Two different vaccines separated by one year X    Next Annual Wellness Visit People with Medicare Every year X Today     Services & Screenings Women Who How Often Need  Date of Last Service Action  Mammogram  Z12.31 Women over 78 One baseline ages 20-39. Annually ager 40 yrs+     Pap tests All women Annually if high risk. Every 2 yrs for normal risk women     Screening for cervical cancer with   Pap (Z01.419 nl or Z01.411abnl) &  HPV Z11.51 Women aged 51 to 43 Once every 5 yrs     Screening pelvic and breast exams All women Annually if high risk. Every 2 yrs for normal risk women     Sexually Transmitted Diseases  Chlamydia  Gonorrhea  Syphilis All at risk adults Annually for non pregnant females at increased risk         [place] Men Who How Ofter Need  Date of Last Service Action  Prostate Cancer - DRE & PSA Men over 50 Annually.  DRE might require a copay.     Sexually Transmitted Diseases  Syphilis All at risk adults Annually for men at increased risk

## 2020-08-23 NOTE — Progress Notes (Signed)
COMPLETE

## 2020-08-23 NOTE — Patient Instructions (Signed)
Mr. Perrell,  It was a pleasure meeting you today in clinic. We discussed your nose bleeds today in clinic and we have a plan to prevent them from happening.  We would like for you to use a Neti Pot about once every other week to help rinse out the mucus that bothers you within your nose. Use this device for cleansing, don't stick your fingers in your nose! I know that it is tempting. Also, try not to blow your nose hard, mucus is normal to have in your nose. It helps prevent dust and debris from getting in there. If you are to have a nose bleed, we would like for you to use the Afrin spray which will help stop the bleeding.  If you have any questions or concerns, please don't hesitate to call our clinic.  Sincerely, Dr. Paulla Dolly, MD
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
## 2020-08-23 NOTE — Progress Notes (Unsigned)
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
## 2020-08-24 NOTE — Progress Notes (Signed)
Internal Medicine Clinic Attending  I saw and evaluated the patient.  I personally confirmed the key portions of the history and exam documented by Dr. Johnson and I reviewed pertinent patient test results.  The assessment, diagnosis, and plan were formulated together and I agree with the documentation in the resident's note.  

## 2020-09-02 ENCOUNTER — Ambulatory Visit: Payer: Medicare Other

## 2020-11-12 ENCOUNTER — Other Ambulatory Visit: Payer: Self-pay | Admitting: Student

## 2020-11-12 DIAGNOSIS — E785 Hyperlipidemia, unspecified: Secondary | ICD-10-CM

## 2020-11-20 ENCOUNTER — Other Ambulatory Visit: Payer: Self-pay | Admitting: Internal Medicine

## 2020-11-20 DIAGNOSIS — H10023 Other mucopurulent conjunctivitis, bilateral: Secondary | ICD-10-CM | POA: Diagnosis not present

## 2020-11-21 NOTE — Telephone Encounter (Signed)
Patient's EC called in requesting BP refills be sent today as patient only has 1 day's worth left.

## 2020-11-21 NOTE — Telephone Encounter (Signed)
Front desk--please arrange an appointment for follow up of his chronic medical conditions  Lauren--30d supply sent to pharmacy

## 2020-11-22 NOTE — Telephone Encounter (Signed)
Spoke with the patient.  He has sch an appt to f/u on 12/01/2020 @ 9:15 am.

## 2020-12-01 ENCOUNTER — Ambulatory Visit (INDEPENDENT_AMBULATORY_CARE_PROVIDER_SITE_OTHER): Payer: Medicare Other | Admitting: Internal Medicine

## 2020-12-01 ENCOUNTER — Encounter: Payer: Self-pay | Admitting: Internal Medicine

## 2020-12-01 VITALS — BP 144/82 | HR 74 | Temp 97.7°F | Wt 220.9 lb

## 2020-12-01 DIAGNOSIS — R7303 Prediabetes: Secondary | ICD-10-CM | POA: Diagnosis not present

## 2020-12-01 DIAGNOSIS — I10 Essential (primary) hypertension: Secondary | ICD-10-CM | POA: Diagnosis not present

## 2020-12-01 DIAGNOSIS — E785 Hyperlipidemia, unspecified: Secondary | ICD-10-CM

## 2020-12-01 DIAGNOSIS — F17200 Nicotine dependence, unspecified, uncomplicated: Secondary | ICD-10-CM | POA: Diagnosis not present

## 2020-12-01 DIAGNOSIS — R040 Epistaxis: Secondary | ICD-10-CM

## 2020-12-01 LAB — POCT GLYCOSYLATED HEMOGLOBIN (HGB A1C): Hemoglobin A1C: 6.3 % — AB (ref 4.0–5.6)

## 2020-12-01 LAB — GLUCOSE, CAPILLARY: Glucose-Capillary: 110 mg/dL — ABNORMAL HIGH (ref 70–99)

## 2020-12-01 MED ORDER — ATORVASTATIN CALCIUM 40 MG PO TABS: 40.0000 mg | ORAL_TABLET | Freq: Every day | ORAL | 1 refills | Status: DC

## 2020-12-01 MED ORDER — OLMESARTAN-AMLODIPINE-HCTZ 40-10-12.5 MG PO TABS: ORAL_TABLET | ORAL | 0 refills | Status: DC

## 2020-12-01 NOTE — Assessment & Plan Note (Signed)
On lipitor 40mg  daily. He reports compliance with this. -check lipid panel today -continue current management

## 2020-12-01 NOTE — Patient Instructions (Signed)
Smoking Tobacco Information, Adult Smoking tobacco can be harmful to your health. Tobacco contains a poisonous (toxic), colorless chemical called nicotine. Nicotine is addictive. It changes the brain and can make it hard to stop smoking. Tobacco also has other toxic chemicals that can hurt your body and raise your risk of many cancers. How can smoking tobacco affect me? Smoking tobacco puts you at risk for: Cancer. Smoking is most commonly associated with lung cancer, but can also lead to cancer in other parts of the body. Chronic obstructive pulmonary disease (COPD). This is a long-term lung condition that makes it hard to breathe. It also gets worse over time. High blood pressure (hypertension), heart disease, stroke, or heart attack. Lung infections, such as pneumonia. Cataracts. This is when the lenses in the eyes become clouded. Digestive problems. This may include peptic ulcers, heartburn, and gastroesophageal reflux disease (GERD). Oral health problems, such as gum disease and tooth loss. Loss of taste and smell. Smoking can affect your appearance by causing: Wrinkles. Yellow or stained teeth, fingers, and fingernails. Smoking tobacco can also affect your social life, because: It may be challenging to find places to smoke when away from home. Many workplaces, [employer], hotels, and public places are tobacco-free. Smoking is expensive. This is due to the cost of tobacco and the long-term costs of treating health problems from smoking. Secondhand smoke may affect those around you. Secondhand smoke can cause lung cancer, breathing problems, and heart disease. Children of smokers have a higher risk for: Sudden infant death syndrome (SIDS). Ear infections. Lung infections. If you currently smoke tobacco, quitting now can help you: Lead a longer and healthier life. Look, smell, breathe, and feel better over time. Save money. Protect others from the harms of secondhand smoke. What  actions can I take to prevent health problems? Quit smoking Do not start smoking. Quit if you already do. Make a plan to quit smoking and commit to it. Look for programs to help you and ask your health care provider for recommendations and ideas. Set a date and write down all the reasons you want to quit. Let your friends and family know you are quitting so they can help and support you. Consider finding friends who also want to quit. It can be easier to quit with someone else, so that you can support each other. Talk with your health care provider about using nicotine replacement medicines to help you quit, such as gum, lozenges, patches, sprays, or pills. Do not replace cigarette smoking with electronic cigarettes, which are commonly called e-cigarettes. The safety of e-cigarettes is not known, and some may contain harmful chemicals. If you try to quit but return to smoking, stay positive. It is common to slip up when you first quit, so take it one day at a time. Be prepared for cravings. When you feel the urge to smoke, chew gum or suck on hard candy.   Lifestyle Stay busy and take care of your body. Drink enough fluid to keep your urine pale yellow. Get plenty of exercise and eat a healthy diet. This can help prevent weight gain after quitting. Monitor your eating habits. Quitting smoking can cause you to have a larger appetite than when you smoke. Find ways to relax. Go out with friends or family to a movie or a restaurant where people do not smoke. Ask your health care provider about having regular tests (screenings) to check for cancer. This may include blood tests, imaging tests, and other tests. Find ways to  manage your stress, such as meditation, yoga, or exercise. Where to find support To get support to quit smoking, consider: Asking your health care provider for more information and resources. Taking classes to learn more about quitting smoking. Looking for local organizations that  offer resources about quitting smoking. Joining a support group for people who want to quit smoking in your local community. Calling the smokefree.gov counselor helpline: 1-800-Quit-Now 215-862-6315) Where to find more information You may find more information about quitting smoking from: HelpGuide.org: www.helpguide.org https://hall.com/: smokefree.gov American Lung Association: www.lung.org Contact a health care provider if you: Have problems breathing. Notice that your lips, nose, or fingers turn blue. Have chest pain. Are coughing up blood. Feel faint or you pass out. Have other health changes that cause you to worry. Summary Smoking tobacco can negatively affect your health, the health of those around you, your finances, and your social life. Do not start smoking. Quit if you already do. If you need help quitting, ask your health care provider. Think about joining a support group for people who want to quit smoking in your local community. There are many effective programs that will help you to quit this behavior. This information is not intended to replace advice given to you by your health care provider. Make sure you discuss any questions you have with your health care provider. Document Revised: 05/28/2019 Document Reviewed: 09/17/2016 Elsevier Patient Education  2021 [employer] Your Hypertension Hypertension, also called high blood pressure, is when the force of the blood pressing against the walls of the arteries is too strong. Arteries are blood vessels that carry blood from your heart throughout your body. Hypertension forces the heart to work harder to pump blood and may cause the arteries to become narrow or stiff. Understanding blood pressure readings Your personal target blood pressure may vary depending on your medical conditions, your age, and other factors. A blood pressure reading includes a higher number over a lower number. Ideally, your blood pressure  should be below 120/80. You should know that:  The first, or top, number is called the systolic pressure. It is a measure of the pressure in your arteries as your heart beats.  The second, or bottom number, is called the diastolic pressure. It is a measure of the pressure in your arteries as the heart relaxes. Blood pressure is classified into four stages. Based on your blood pressure reading, your health care provider may use the following stages to determine what type of treatment you need, if any. Systolic pressure and diastolic pressure are measured in a unit called mmHg. Normal  Systolic pressure: below 779.  Diastolic pressure: below 80. Elevated  Systolic pressure: 390-300.  Diastolic pressure: below 80. Hypertension stage 1  Systolic pressure: 923-300.  Diastolic pressure: 76-22. Hypertension stage 2  Systolic pressure: 633 or above.  Diastolic pressure: 90 or above. How can this condition affect me? Managing your hypertension is an important responsibility. Over time, hypertension can damage the arteries and decrease blood flow to important parts of the body, including the brain, heart, and kidneys. Having untreated or uncontrolled hypertension can lead to:  A heart attack.  A stroke.  A weakened blood vessel (aneurysm).  Heart failure.  Kidney damage.  Eye damage.  Metabolic syndrome.  Memory and concentration problems.  Vascular dementia. What actions can I take to manage this condition? Hypertension can be managed by making lifestyle changes and possibly by taking medicines. Your health care provider will help you make a plan  to bring your blood pressure within a normal range. Nutrition  Eat a diet that is high in fiber and potassium, and low in salt (sodium), added sugar, and fat. An example eating plan is called the Dietary Approaches to Stop Hypertension (DASH) diet. To eat this way: ? Eat plenty of fresh fruits and vegetables. Try to fill one-half of  your plate at each meal with fruits and vegetables. ? Eat whole grains, such as whole-wheat pasta, brown rice, or whole-grain bread. Fill about one-fourth of your plate with whole grains. ? Eat low-fat dairy products. ? Avoid fatty cuts of meat, processed or cured meats, and poultry with skin. Fill about one-fourth of your plate with lean proteins such as fish, chicken without skin, beans, eggs, and tofu. ? Avoid pre-made and processed foods. These tend to be higher in sodium, added sugar, and fat.  Reduce your daily sodium intake. Most people with hypertension should eat less than 1,500 mg of sodium a day.   Lifestyle  Work with your health care provider to maintain a healthy body weight or to lose weight. Ask what an ideal weight is for you.  Get at least 30 minutes of exercise that causes your heart to beat faster (aerobic exercise) most days of the week. Activities may include walking, swimming, or biking.  Include exercise to strengthen your muscles (resistance exercise), such as weight lifting, as part of your weekly exercise routine. Try to do these types of exercises for 30 minutes at least 3 days a week.  Do not use any products that contain nicotine or tobacco, such as cigarettes, e-cigarettes, and chewing tobacco. If you need help quitting, ask your health care provider.  Control any long-term (chronic) conditions you have, such as high cholesterol or diabetes.  Identify your sources of stress and find ways to manage stress. This may include meditation, deep breathing, or making time for fun activities.   Alcohol use  Do not drink alcohol if: ? Your health care provider tells you not to drink. ? You are pregnant, may be pregnant, or are planning to become pregnant.  If you drink alcohol: ? Limit how much you use to:  0-1 drink a day for women.  0-2 drinks a day for men. ? Be aware of how much alcohol is in your drink. In the [place] one drink equals one 12 oz bottle of beer  (355 mL), one 5 oz glass of wine (148 mL), or one 1 oz glass of hard liquor (44 mL). Medicines Your health care provider may prescribe medicine if lifestyle changes are not enough to get your blood pressure under control and if:  Your systolic blood pressure is 130 or higher.  Your diastolic blood pressure is 80 or higher. Take medicines only as told by your health care provider. Follow the directions carefully. Blood pressure medicines must be taken as told by your health care provider. The medicine does not work as well when you skip doses. Skipping doses also puts you at risk for problems. Monitoring Before you monitor your blood pressure:  Do not smoke, drink caffeinated beverages, or exercise within 30 minutes before taking a measurement.  Use the bathroom and empty your bladder (urinate).  Sit quietly for at least 5 minutes before taking measurements. Monitor your blood pressure at home as told by your health care provider. To do this:  Sit with your back straight and supported.  Place your feet flat on the floor. Do not cross your legs.  Support  your arm on a flat surface, such as a table. Make sure your upper arm is at heart level.  Each time you measure, take two or three readings one minute apart and record the results. You may also need to have your blood pressure checked regularly by your health care provider.   General information  Talk with your health care provider about your diet, exercise habits, and other lifestyle factors that may be contributing to hypertension.  Review all the medicines you take with your health care provider because there may be side effects or interactions.  Keep all visits as told by your health care provider. Your health care provider can help you create and adjust your plan for managing your high blood pressure. Where to find more information  National Heart, Lung, and Blood Institute: https://wilson-eaton.com/  American Heart Association:  www.heart.org Contact a health care provider if:  You think you are having a reaction to medicines you have taken.  You have repeated (recurrent) headaches.  You feel dizzy.  You have swelling in your ankles.  You have trouble with your vision. Get help right away if:  You develop a severe headache or confusion.  You have unusual weakness or numbness, or you feel faint.  You have severe pain in your chest or abdomen.  You vomit repeatedly.  You have trouble breathing. These symptoms may represent a serious problem that is an emergency. Do not wait to see if the symptoms will go away. Get medical help right away. Call your local emergency services (911 in the [place] Do not drive yourself to the hospital. Summary  Hypertension is when the force of blood pumping through your arteries is too strong. If this condition is not controlled, it may put you at risk for serious complications.  Your personal target blood pressure may vary depending on your medical conditions, your age, and other factors. For most people, a normal blood pressure is less than 120/80.  Hypertension is managed by lifestyle changes, medicines, or both.  Lifestyle changes to help manage hypertension include losing weight, eating a healthy, low-sodium diet, exercising more, stopping smoking, and limiting alcohol. This information is not intended to replace advice given to you by your health care provider. Make sure you discuss any questions you have with your health care provider. Document Revised: 10/08/2019 Document Reviewed: 08/03/2019 Elsevier Patient Education  2021 [employer].

## 2020-12-01 NOTE — Assessment & Plan Note (Signed)
Current medications: amlodipine-olmesartan 10-40mg , hctz 12.5mg  He has not been taking the hctz  Blood pressure is above goal in the office today, consistent with prior readings BP Readings from Last 3 Encounters:  12/01/20 (!) 148/86  08/23/20 136/82  05/29/20 (!) 156/84   Plan -olmesartan-amlodipine-hctz 40-10-12.5. hopeful this will help compliance -BMP today -f/u 4w for blood pressure recheck

## 2020-12-01 NOTE — Assessment & Plan Note (Signed)
A1C today is 6.3 -discussed lifestyle modifications -medication not indicated at this time

## 2020-12-01 NOTE — Addendum Note (Signed)
Addended by: Mitzi Hansen on: 12/01/2020 02:36 PM   Modules accepted: Orders

## 2020-12-01 NOTE — Assessment & Plan Note (Signed)
No further episodes since last OV. Will resolve this issue

## 2020-12-01 NOTE — Progress Notes (Signed)
Office Visit   Patient ID: Samuel Weiss, male    DOB: 02/08/1951, 70 y.o.   MRN: [id number]  Subjective:  CC: chronic hypertension, prediabetes, tobacco use disorder  70 y.o. presents today for follow up of his chronic medical conditions. He has no new complaints today. Please refer to problem based charting for details of assessment and plan.      ACTIVE MEDICATIONS   Outpatient Medications Prior to Visit  Medication Sig Dispense Refill  . oxymetazoline (AFRIN NASAL SPRAY) 0.05 % nasal spray Place 1 spray into both nostrils 2 (two) times daily. 30 mL 0  . Sodium Chloride-Sodium Bicarb (CLASSIC NETI POT SINUS WASH) 2300-700 MG KIT Place 1 application into the nose as needed. 1 kit 0  . amLODipine-olmesartan (AZOR) 10-40 MG tablet Take 1 tablet by mouth once daily 30 tablet 0  . atorvastatin (LIPITOR) 40 MG tablet TAKE 1 TABLET BY MOUTH EVERY DAY 90 tablet 1  . hydrochlorothiazide (MICROZIDE) 12.5 MG capsule Take 1 capsule by mouth once daily 30 capsule 0   No facility-administered medications prior to visit.     Objective:   BP (!) 144/82 (BP Location: Left Arm, Patient Position: Sitting, Cuff Size: Normal)   Pulse 74   Temp 97.7 F (36.5 C) (Oral)   Wt 220 lb 14.4 oz (100.2 kg)   SpO2 98%   BMI 33.59 kg/m  Wt Readings from Last 3 Encounters:  12/01/20 220 lb 14.4 oz (100.2 kg)  08/23/20 217 lb 4.8 oz (98.6 kg)  05/29/20 218 lb 1.6 oz (98.9 kg)   BP Readings from Last 3 Encounters:  12/01/20 (!) 144/82  08/23/20 136/82  05/29/20 (!) 156/84   Physical Exam General: well appearing in NAD Cardiac: RRR, no LE edema Pulm: lungs clear GI: not distended  Health Maintenance:   Health Maintenance  Topic Date Due  . TETANUS/TDAP  Never done  . COLONOSCOPY (Pts 45-65yr Insurance coverage will need to be confirmed)  Never done  . COVID-19 Vaccine (3 - Booster for Pfizer series) 06/02/2020  . PNA vac Low Risk Adult (2 of 2 - PPSV23) 07/05/2020  . INFLUENZA VACCINE   Completed  . Hepatitis C Screening  Completed  . HPV VACCINES  Aged Out     Assessment & Plan:   Problem List Items Addressed This Visit      Cardiovascular and Mediastinum   Essential hypertension - Primary (Chronic)    Current medications: amlodipine-olmesartan 10-438m hctz 12.39m4me has not been taking the hctz  Blood pressure is above goal in the office today, consistent with prior readings BP Readings from Last 3 Encounters:  12/01/20 (!) 148/86  08/23/20 136/82  05/29/20 (!) 156/84   Plan -olmesartan-amlodipine-hctz 40-10-12.5. hopeful this will help compliance -BMP today -f/u 4w for blood pressure recheck      Relevant Medications   Olmesartan-amLODIPine-HCTZ 40-10-12.5 MG TABS   atorvastatin (LIPITOR) 40 MG tablet   Other Relevant Orders   Basic metabolic panel     Other   HLD (hyperlipidemia) (Chronic)    On lipitor 34m81mily. He reports compliance with this. -check lipid panel today -continue current management      Relevant Medications   Olmesartan-amLODIPine-HCTZ 40-10-12.5 MG TABS   atorvastatin (LIPITOR) 40 MG tablet   Other Relevant Orders   Lipid panel   Tobacco use disorder (Chronic)    No significant changes in cigarette use from prior visits. Discussed long term effects of cigarette smoking. Offered referral for smoking cessation however he  declines at this time. Does not fit criteria for lung cancer screening with LDCT      Prediabetes (Chronic)    A1C today is 6.3 -discussed lifestyle modifications -medication not indicated at this time      Relevant Orders   POC Hbg A1C (Completed)   Glucose, capillary (Completed)   RESOLVED: Recurrent epistaxis    No further episodes since last OV. Will resolve this issue        Return in about 4 weeks (around 12/29/2020) for blood pressure recheck.   Pt discussed with Dr. Clista Bernhardt, MD Internal Medicine Resident PGY-2 [facility] Pager:  [phone] 12/01/2020 10:11 AM

## 2020-12-01 NOTE — Assessment & Plan Note (Signed)
No significant changes in cigarette use from prior visits. Discussed long term effects of cigarette smoking. Offered referral for smoking cessation however he declines at this time. Does not fit criteria for lung cancer screening with LDCT

## 2020-12-02 LAB — LIPID PANEL
Chol/HDL Ratio: 3.5 ratio (ref 0.0–5.0)
Cholesterol, Total: 142 mg/dL (ref 100–199)
HDL: 41 mg/dL (ref >39)
LDL Chol Calc (NIH): 86 mg/dL (ref 0–99)
Triglycerides: 74 mg/dL (ref 0–149)
VLDL Cholesterol Cal: 15 mg/dL (ref 5–40)

## 2020-12-02 LAB — BASIC METABOLIC PANEL
BUN/Creatinine Ratio: 18 (ref 10–24)
BUN: 16 mg/dL (ref 8–27)
CO2: 22 mmol/L (ref 20–29)
Calcium: 9.4 mg/dL (ref 8.6–10.2)
Chloride: 105 mmol/L (ref 96–106)
Creatinine, Ser: 0.89 mg/dL (ref 0.76–1.27)
Glucose: 112 mg/dL — ABNORMAL HIGH (ref 65–99)
Potassium: 4.5 mmol/L (ref 3.5–5.2)
Sodium: 142 mmol/L (ref 134–144)
eGFR: 92 mL/min/{1.73_m2} (ref >59)

## 2020-12-21 NOTE — Progress Notes (Signed)
Internal Medicine Clinic Attending  Case discussed with Dr. Christian at the time of the visit.  We reviewed the resident's history and exam and pertinent patient test results.  I agree with the assessment, diagnosis, and plan of care documented in the resident's note.  Olivia Pavelko, M.D., Ph.D.  

## 2021-03-20 ENCOUNTER — Encounter: Payer: Self-pay | Admitting: *Deleted

## 2021-03-24 ENCOUNTER — Other Ambulatory Visit: Payer: Self-pay | Admitting: Internal Medicine

## 2021-03-26 NOTE — Telephone Encounter (Signed)
Patient notified refill has been sent. He is very appreciative. 

## 2021-03-26 NOTE — Telephone Encounter (Signed)
Olmesartan-amLODIPine-HCTZ 40-10-12.5 MG TABS, REFILL REQUEST @  [place], [address] Phone:  [phone]  Fax:  [phone]    Requesting this med to be filled by today. Pt would like a call back.

## 2021-04-02 ENCOUNTER — Ambulatory Visit (INDEPENDENT_AMBULATORY_CARE_PROVIDER_SITE_OTHER): Payer: Medicare Other | Admitting: Pharmacist

## 2021-04-02 ENCOUNTER — Encounter: Payer: Self-pay | Admitting: Pharmacist

## 2021-04-02 DIAGNOSIS — Z0000 Encounter for general adult medical examination without abnormal findings: Secondary | ICD-10-CM | POA: Diagnosis not present

## 2021-04-02 NOTE — Patient Instructions (Addendum)
Annual Wellness Visit   Medicare Covered Preventative Screenings and Services  Services & Screenings Men and Women Who How Often Need? Date of Last Service Action  Abdominal Aortic Aneurysm Adults with AAA risk factors Once     Alcohol Misuse and Counseling All Adults Screening once a year if no alcohol misuse. Counseling up to 4 face to face sessions.     Bone Density Measurement  Adults at risk for osteoporosis Once every 2 yrs     Lipid Panel Z13.6 All adults without CV disease Once every 5 yrs     Colorectal Cancer  Stool sample or Colonoscopy All adults 45 and older  Once every year Every 10 years     Depression All Adults Once a year  Today   Diabetes Screening Blood glucose, post glucose load, or GTT Z13.1 All adults at risk Pre-diabetics Once per year Twice per year     Diabetes  Self-Management Training All adults Diabetics 10 hrs first year; 2 hours subsequent years. Requires Copay     Glaucoma Diabetics Family history of glaucoma African Americans 83 yrs + Hispanic Americans 34 yrs + Annually - requires coppay     Hepatitis C Z72.89 or F19.20 High Risk for HCV Born between 1945 and 1965 Annually Once     HIV Z11.4 All adults based on risk Annually btw ages 74 & 79 regardless of risk Annually > 65 yrs if at increased risk     Lung Cancer Screening Asymptomatic adults aged 39-77 with 30 pack yr history and current smoker OR quit within the last 15 yrs Annually Must have counseling and shared decision making documentation before first screen     Medical Nutrition Therapy Adults with  Diabetes Renal disease Kidney transplant within past 3 yrs 3 hours first year; 2 hours subsequent years     Obesity and Counseling All adults Screening once a year Counseling if BMI 30 or higher  Today   Tobacco Use Counseling Adults who use tobacco  Up to 8 visits in one year     Vaccines Z23 Hepatitis B Influenza  Pneumonia  Adults  Once Once every flu season Two different  vaccines separated by one year     Next Annual Wellness Visit People with Medicare Every year  Today     Services & Screenings Women Who How Often Need  Date of Last Service Action  Mammogram  Z12.31 Women over 22 One baseline ages 62-39. Annually ager 40 yrs+     Pap tests All women Annually if high risk. Every 2 yrs for normal risk women     Screening for cervical cancer with  Pap (Z01.419 nl or Z01.411abnl) & HPV Z11.51 Women aged 67 to 27 Once every 5 yrs     Screening pelvic and breast exams All women Annually if high risk. Every 2 yrs for normal risk women     Sexually Transmitted Diseases Chlamydia Gonorrhea Syphilis All at risk adults Annually for non pregnant females at increased risk         [place] Men Who How Ofter Need  Date of Last Service Action  Prostate Cancer - DRE & PSA Men over 50 Annually.  DRE might require a copay.     Sexually Transmitted Diseases Syphilis All at risk adults Annually for men at increased risk         Things That May Be Affecting Your Health:  Alcohol  Hearing loss  Pain    Depression  Home  Safety  Sexual Health   Diabetes  Lack of physical activity  Stress   Difficulty with daily activities  Loneliness  Tiredness   Drug use  Medicines  Tobacco use   Falls  Motor Vehicle Safety  Weight   Food choices  Oral Health  Other    YOUR PERSONALIZED HEALTH PLAN : 1. Schedule your next subsequent Medicare Wellness visit in one year 2. Attend all of your regular appointments to address your medical issues 3. Complete the preventative screenings and services. I have included information regarding the vaccines you are due for. 4. Continue to work towards quitting smoking. Great job on your progress thus far! If you need additional help our resources please contact our office.  5. Keep up the great work exercising! Remember the goal is 150 minutes of exercise weekly.   Fall Prevention in the Home, Adult Falls can cause  injuries and can happen to people of all ages. There are many things you can do to make your home safe and to help prevent falls. Ask forhelp when making these changes. What actions can I take to prevent falls? General Instructions Use good lighting in all rooms. Replace any light bulbs that burn out. Turn on the lights in dark areas. Use night-lights. Keep items that you use often in easy-to-reach places. Lower the shelves around your home if needed. Set up your furniture so you have a clear path. Avoid moving your furniture around. Do not have throw rugs or other things on the floor that can make you trip. Avoid walking on wet floors. If any of your floors are uneven, fix them. Add color or contrast paint or tape to clearly mark and help you see: Grab bars or handrails. First and last steps of staircases. Where the edge of each step is. If you use a stepladder: Make sure that it is fully opened. Do not climb a closed stepladder. Make sure the sides of the stepladder are locked in place. Ask someone to hold the stepladder while you use it. Know where your pets are when moving through your home. What can I do in the bathroom?     Keep the floor dry. Clean up any water on the floor right away. Remove soap buildup in the tub or shower. Use nonskid mats or decals on the floor of the tub or shower. Attach bath mats securely with double-sided, nonslip rug tape. If you need to sit down in the shower, use a plastic, nonslip stool. Install grab bars by the toilet and in the tub and shower. Do not use towel bars as grab bars. What can I do in the bedroom? Make sure that you have a light by your bed that is easy to reach. Do not use any sheets or blankets for your bed that hang to the floor. Have a firm chair with side arms that you can use for support when you get dressed. What can I do in the kitchen? Clean up any spills right away. If you need to reach something above you, use a step  stool with a grab bar. Keep electrical cords out of the way. Do not use floor polish or wax that makes floors slippery. What can I do with my stairs? Do not leave any items on the stairs. Make sure that you have a light switch at the top and the bottom of the stairs. Make sure that there are handrails on both sides of the stairs. Fix handrails that are broken or  loose. Install nonslip stair treads on all your stairs. Avoid having throw rugs at the top or bottom of the stairs. Choose a carpet that does not hide the edge of the steps on the stairs. Check carpeting to make sure that it is firmly attached to the stairs. Fix carpet that is loose or worn. What can I do on the outside of my home? Use bright outdoor lighting. Fix the edges of walkways and driveways and fix any cracks. Remove anything that might make you trip as you walk through a door, such as a raised step or threshold. Trim any bushes or trees on paths to your home. Check to see if handrails are loose or broken and that both sides of all steps have handrails. Install guardrails along the edges of any raised decks and porches. Clear paths of anything that can make you trip, such as tools or rocks. Have leaves, snow, or ice cleared regularly. Use sand or salt on paths during winter. Clean up any spills in your garage right away. This includes grease or oil spills. What other actions can I take? Wear shoes that: Have a low heel. Do not wear high heels. Have rubber bottoms. Feel good on your feet and fit well. Are closed at the toe. Do not wear open-toe sandals. Use tools that help you move around if needed. These include: Canes. Walkers. Scooters. Crutches. Review your medicines with your doctor. Some medicines can make you feel dizzy. This can increase your chance of falling. Ask your doctor what else you can do to help prevent falls. Where to find more information Centers for Disease Control and Prevention, STEAD[employer]:  http://www.wolf.info/ National Institute on Aging: http://kim-miller.com/ Contact a doctor if: You are afraid of falling at home. You feel weak, drowsy, or dizzy at home. You fall at home. Summary There are many simple things that you can do to make your home safe and to help prevent falls. Ways to make your home safe include removing things that can make you trip and installing grab bars in the bathroom. Ask for help when making these changes in your home. This information is not intended to replace advice given to you by your health care provider. Make sure you discuss any questions you have with your healthcare provider. Document Revised: 04/05/2020 Document Reviewed: 04/05/2020 Elsevier Patient Education  [place] Maintenance, Male Adopting a healthy lifestyle and getting preventive care are important in promoting health and wellness. Ask your health care provider about: The right schedule for you to have regular tests and exams. Things you can do on your own to prevent diseases and keep yourself healthy. What should I know about diet, weight, and exercise? Eat a healthy diet  Eat a diet that includes plenty of vegetables, fruits, low-fat dairy products, and lean protein. Do not eat a lot of foods that are high in solid fats, added sugars, or sodium.  Maintain a healthy weight Body mass index (BMI) is a measurement that can be used to identify possible weight problems. It estimates body fat based on height and weight. Your health care provider can help determine your BMI and help you achieve or maintain ahealthy weight. Get regular exercise Get regular exercise. This is one of the most important things you can do for your health. Most adults should: Exercise for at least 150 minutes each week. The exercise should increase your heart rate and make you sweat (moderate-intensity exercise). Do strengthening exercises at least twice a week. This  is in addition to the moderate-intensity  exercise. Spend less time sitting. Even light physical activity can be beneficial. Watch cholesterol and blood lipids Have your blood tested for lipids and cholesterol at 72 years of age, then havethis test every 5 years. You may need to have your cholesterol levels checked more often if: Your lipid or cholesterol levels are high. You are older than 72 years of age. You are at high risk for heart disease. What should I know about cancer screening? Many types of cancers can be detected early and may often be prevented. Depending on your health history and family history, you may need to have cancer screening at various ages. This may include screening for: Colorectal cancer. Prostate cancer. Skin cancer. Lung cancer. What should I know about heart disease, diabetes, and high blood pressure? Blood pressure and heart disease High blood pressure causes heart disease and increases the risk of stroke. This is more likely to develop in people who have high blood pressure readings, are of African descent, or are overweight. Talk with your health care provider about your target blood pressure readings. Have your blood pressure checked: Every 3-5 years if you are 73-15 years of age. Every year if you are 2 years old or older. If you are between the ages of 71 and 76 and are a current or former smoker, ask your health care provider if you should have a one-time screening for abdominal aortic aneurysm (AAA). Diabetes Have regular diabetes screenings. This checks your fasting blood sugar level. Have the screening done: Once every three years after age 78 if you are at a normal weight and have a low risk for diabetes. More often and at a younger age if you are overweight or have a high risk for diabetes. What should I know about preventing infection? Hepatitis B If you have a higher risk for hepatitis B, you should be screened for this virus. Talk with your health care provider to find out if you are  at risk forhepatitis B infection. Hepatitis C Blood testing is recommended for: Everyone born from [birth year] through 1965. Anyone with known risk factors for hepatitis C. Sexually transmitted infections (STIs) You should be screened each year for STIs, including gonorrhea and chlamydia, if: You are sexually active and are younger than 72 years of age. You are older than 72 years of age and your health care provider tells you that you are at risk for this type of infection. Your sexual activity has changed since you were last screened, and you are at increased risk for chlamydia or gonorrhea. Ask your health care provider if you are at risk. Ask your health care provider about whether you are at high risk for HIV. Your health care provider may recommend a prescription medicine to help prevent HIV infection. If you choose to take medicine to prevent HIV, you should first get tested for HIV. You should then be tested every 3 months for as long as you are taking the medicine. Follow these instructions at home: Lifestyle Do not use any products that contain nicotine or tobacco, such as cigarettes, e-cigarettes, and chewing tobacco. If you need help quitting, ask your health care provider. Do not use street drugs. Do not share needles. Ask your health care provider for help if you need support or information about quitting drugs. Alcohol use Do not drink alcohol if your health care provider tells you not to drink. If you drink alcohol: Limit how much you have to 0-2 drinks  a day. Be aware of how much alcohol is in your drink. In the [place] one drink equals one 12 oz bottle of beer (355 mL), one 5 oz glass of wine (148 mL), or one 1 oz glass of hard liquor (44 mL). General instructions Schedule regular health, dental, and eye exams. Stay current with your vaccines. Tell your health care provider if: You often feel depressed. You have ever been abused or do not feel safe at home. Summary Adopting a  healthy lifestyle and getting preventive care are important in promoting health and wellness. Follow your health care provider's instructions about healthy diet, exercising, and getting tested or screened for diseases. Follow your health care provider's instructions on monitoring your cholesterol and blood pressure. This information is not intended to replace advice given to you by your health care provider. Make sure you discuss any questions you have with your healthcare provider. Document Revised: 08/26/2018 Document Reviewed: 08/26/2018 Elsevier Patient Education  2022 [place].  Steps to Quit Smoking Smoking tobacco is the leading cause of preventable death. It can affect almost every organ in the body. Smoking puts you and people around you at risk for many serious, long-lasting (chronic) diseases. Quitting smoking can be hard, but it is one of the best things thatyou can do for your health. It is never too late to quit. How do I get ready to quit? When you decide to quit smoking, make a plan to help you succeed. Before you quit: Pick a date to quit. Set a date within the next 2 weeks to give you time to prepare. Write down the reasons why you are quitting. Keep this list in places where you will see it often. Tell your family, friends, and co-workers that you are quitting. Their support is important. Talk with your doctor about the choices that may help you quit. Find out if your health insurance will pay for these treatments. Know the people, places, things, and activities that make you want to smoke (triggers). Avoid them. What first steps can I take to quit smoking? Throw away all cigarettes at home, at work, and in your car. Throw away the things that you use when you smoke, such as ashtrays and lighters. Clean your car. Make sure to empty the ashtray. Clean your home, including curtains and carpets. What can I do to help me quit smoking? Talk with your doctor about taking  medicines and seeing a counselor at the same time. You are more likely to succeed when you do both. If you are pregnant or breastfeeding, talk with your doctor about counseling or other ways to quit smoking. Do not take medicine to help you quit smoking unless your doctor tells you to do so. To quit smoking: Quit right away Quit smoking totally, instead of slowly cutting back on how much you smoke over a period of time. Go to counseling. You are more likely to quit if you go to counseling sessions regularly. Take medicine You may take medicines to help you quit. Some medicines need a prescription, and some you can buy over-the-counter. Some medicines may contain a drug called nicotine to replace the nicotine in cigarettes. Medicines may: Help you to stop having the desire to smoke (cravings). Help to stop the problems that come when you stop smoking (withdrawal symptoms). Your doctor may ask you to use: Nicotine patches, gum, or lozenges. Nicotine inhalers or sprays. Non-nicotine medicine that is taken by mouth. Find resources Find resources and other ways to help  you quit smoking and remain smoke-free after you quit. These resources are most helpful when you use them often. They include: Online chats with a Social worker. Phone quitlines. Printed Furniture conservator/restorer. Support groups or group counseling. Text messaging programs. Mobile phone apps. Use apps on your mobile phone or tablet that can help you stick to your quit plan. There are many free apps for mobile phones and tablets as well as websites. Examples include Quit Guide from the [employer] and smokefree.gov  What things can I do to make it easier to quit?  Talk to your family and friends. Ask them to support and encourage you. Call a phone quitline (1-800-QUIT-NOW), reach out to support groups, or work with a Social worker. Ask people who smoke to not smoke around you. Avoid places that make you want to smoke, such  as: Bars. Parties. Smoke-break areas at work. Spend time with people who do not smoke. Lower the stress in your life. Stress can make you want to smoke. Try these things to help your stress: Getting regular exercise. Doing deep-breathing exercises. Doing yoga. Meditating. Doing a body scan. To do this, close your eyes, focus on one area of your body at a time from head to toe. Notice which parts of your body are tense. Try to relax the muscles in those areas. How will I feel when I quit smoking? Day 1 to 3 weeks Within the first 24 hours, you may start to have some problems that come from quitting tobacco. These problems are very bad 2-3 days after you quit, but they do not often last for more than 2-3 weeks. You may get these symptoms: Mood swings. Feeling restless, nervous, angry, or annoyed. Trouble concentrating. Dizziness. Strong desire for high-sugar foods and nicotine. Weight gain. Trouble pooping (constipation). Feeling like you may vomit (nausea). Coughing or a sore throat. Changes in how the medicines that you take for other issues work in your body. Depression. Trouble sleeping (insomnia). Week 3 and afterward After the first 2-3 weeks of quitting, you may start to notice more positive results, such as: Better sense of smell and taste. Less coughing and sore throat. Slower heart rate. Lower blood pressure. Clearer skin. Better breathing. Fewer sick days. Quitting smoking can be hard. Do not give up if you fail the first time. Some people need to try a few times before they succeed. Do your best to stick to your quit plan, and talk with yourdoctor if you have any questions or concerns. Summary Smoking tobacco is the leading cause of preventable death. Quitting smoking can be hard, but it is one of the best things that you can do for your health. When you decide to quit smoking, make a plan to help you succeed. Quit smoking right away, not slowly over a period of  time. When you start quitting, seek help from your doctor, family, or friends. This information is not intended to replace advice given to you by your health care provider. Make sure you discuss any questions you have with your healthcare provider. Document Revised: 05/28/2019 Document Reviewed: 11/21/2018 Elsevier Patient Education  2022 [place] Prevention in the Home, Adult Falls can cause injuries and can happen to people of all ages. There are many things you can do to make your home safe and to help prevent falls. Ask forhelp when making these changes. What actions can I take to prevent falls? General Instructions Use good lighting in all rooms. Replace any light bulbs that burn out. Turn  on the lights in dark areas. Use night-lights. Keep items that you use often in easy-to-reach places. Lower the shelves around your home if needed. Set up your furniture so you have a clear path. Avoid moving your furniture around. Do not have throw rugs or other things on the floor that can make you trip. Avoid walking on wet floors. If any of your floors are uneven, fix them. Add color or contrast paint or tape to clearly mark and help you see: Grab bars or handrails. First and last steps of staircases. Where the edge of each step is. If you use a stepladder: Make sure that it is fully opened. Do not climb a closed stepladder. Make sure the sides of the stepladder are locked in place. Ask someone to hold the stepladder while you use it. Know where your pets are when moving through your home. What can I do in the bathroom?     Keep the floor dry. Clean up any water on the floor right away. Remove soap buildup in the tub or shower. Use nonskid mats or decals on the floor of the tub or shower. Attach bath mats securely with double-sided, nonslip rug tape. If you need to sit down in the shower, use a plastic, nonslip stool. Install grab bars by the toilet and in the tub and shower.  Do not use towel bars as grab bars. What can I do in the bedroom? Make sure that you have a light by your bed that is easy to reach. Do not use any sheets or blankets for your bed that hang to the floor. Have a firm chair with side arms that you can use for support when you get dressed. What can I do in the kitchen? Clean up any spills right away. If you need to reach something above you, use a step stool with a grab bar. Keep electrical cords out of the way. Do not use floor polish or wax that makes floors slippery. What can I do with my stairs? Do not leave any items on the stairs. Make sure that you have a light switch at the top and the bottom of the stairs. Make sure that there are handrails on both sides of the stairs. Fix handrails that are broken or loose. Install nonslip stair treads on all your stairs. Avoid having throw rugs at the top or bottom of the stairs. Choose a carpet that does not hide the edge of the steps on the stairs. Check carpeting to make sure that it is firmly attached to the stairs. Fix carpet that is loose or worn. What can I do on the outside of my home? Use bright outdoor lighting. Fix the edges of walkways and driveways and fix any cracks. Remove anything that might make you trip as you walk through a door, such as a raised step or threshold. Trim any bushes or trees on paths to your home. Check to see if handrails are loose or broken and that both sides of all steps have handrails. Install guardrails along the edges of any raised decks and porches. Clear paths of anything that can make you trip, such as tools or rocks. Have leaves, snow, or ice cleared regularly. Use sand or salt on paths during winter. Clean up any spills in your garage right away. This includes grease or oil spills. What other actions can I take? Wear shoes that: Have a low heel. Do not wear high heels. Have rubber bottoms. Feel good on your feet  and fit well. Are closed at the  toe. Do not wear open-toe sandals. Use tools that help you move around if needed. These include: Canes. Walkers. Scooters. Crutches. Review your medicines with your doctor. Some medicines can make you feel dizzy. This can increase your chance of falling. Ask your doctor what else you can do to help prevent falls. Where to find more information Centers for Disease Control and Prevention, STEAD[employer]: http://www.wolf.info/ National Institute on Aging: http://kim-miller.com/ Contact a doctor if: You are afraid of falling at home. You feel weak, drowsy, or dizzy at home. You fall at home. Summary There are many simple things that you can do to make your home safe and to help prevent falls. Ways to make your home safe include removing things that can make you trip and installing grab bars in the bathroom. Ask for help when making these changes in your home. This information is not intended to replace advice given to you by your health care provider. Make sure you discuss any questions you have with your healthcare provider. Document Revised: 04/05/2020 Document Reviewed: 04/05/2020 Elsevier Patient Education  [place] Maintenance, Male Adopting a healthy lifestyle and getting preventive care are important in promoting health and wellness. Ask your health care provider about: The right schedule for you to have regular tests and exams. Things you can do on your own to prevent diseases and keep yourself healthy. What should I know about diet, weight, and exercise? Eat a healthy diet  Eat a diet that includes plenty of vegetables, fruits, low-fat dairy products, and lean protein. Do not eat a lot of foods that are high in solid fats, added sugars, or sodium.  Maintain a healthy weight Body mass index (BMI) is a measurement that can be used to identify possible weight problems. It estimates body fat based on height and weight. Your health care provider can help determine your BMI and help you  achieve or maintain ahealthy weight. Get regular exercise Get regular exercise. This is one of the most important things you can do for your health. Most adults should: Exercise for at least 150 minutes each week. The exercise should increase your heart rate and make you sweat (moderate-intensity exercise). Do strengthening exercises at least twice a week. This is in addition to the moderate-intensity exercise. Spend less time sitting. Even light physical activity can be beneficial. Watch cholesterol and blood lipids Have your blood tested for lipids and cholesterol at 72 years of age, then havethis test every 5 years. You may need to have your cholesterol levels checked more often if: Your lipid or cholesterol levels are high. You are older than 72 years of age. You are at high risk for heart disease. What should I know about cancer screening? Many types of cancers can be detected early and may often be prevented. Depending on your health history and family history, you may need to have cancer screening at various ages. This may include screening for: Colorectal cancer. Prostate cancer. Skin cancer. Lung cancer. What should I know about heart disease, diabetes, and high blood pressure? Blood pressure and heart disease High blood pressure causes heart disease and increases the risk of stroke. This is more likely to develop in people who have high blood pressure readings, are of African descent, or are overweight. Talk with your health care provider about your target blood pressure readings. Have your blood pressure checked: Every 3-5 years if you are 75-60 years of age. Every year if you  are 80 years old or older. If you are between the ages of 76 and 38 and are a current or former smoker, ask your health care provider if you should have a one-time screening for abdominal aortic aneurysm (AAA). Diabetes Have regular diabetes screenings. This checks your fasting blood sugar level. Have the  screening done: Once every three years after age 10 if you are at a normal weight and have a low risk for diabetes. More often and at a younger age if you are overweight or have a high risk for diabetes. What should I know about preventing infection? Hepatitis B If you have a higher risk for hepatitis B, you should be screened for this virus. Talk with your health care provider to find out if you are at risk forhepatitis B infection. Hepatitis C Blood testing is recommended for: Everyone born from [birth year] through 1967. Anyone with known risk factors for hepatitis C. Sexually transmitted infections (STIs) You should be screened each year for STIs, including gonorrhea and chlamydia, if: You are sexually active and are younger than 72 years of age. You are older than 72 years of age and your health care provider tells you that you are at risk for this type of infection. Your sexual activity has changed since you were last screened, and you are at increased risk for chlamydia or gonorrhea. Ask your health care provider if you are at risk. Ask your health care provider about whether you are at high risk for HIV. Your health care provider may recommend a prescription medicine to help prevent HIV infection. If you choose to take medicine to prevent HIV, you should first get tested for HIV. You should then be tested every 3 months for as long as you are taking the medicine. Follow these instructions at home: Lifestyle Do not use any products that contain nicotine or tobacco, such as cigarettes, e-cigarettes, and chewing tobacco. If you need help quitting, ask your health care provider. Do not use street drugs. Do not share needles. Ask your health care provider for help if you need support or information about quitting drugs. Alcohol use Do not drink alcohol if your health care provider tells you not to drink. If you drink alcohol: Limit how much you have to 0-2 drinks a day. Be aware of how much  alcohol is in your drink. In the [place] one drink equals one 12 oz bottle of beer (355 mL), one 5 oz glass of wine (148 mL), or one 1 oz glass of hard liquor (44 mL). General instructions Schedule regular health, dental, and eye exams. Stay current with your vaccines. Tell your health care provider if: You often feel depressed. You have ever been abused or do not feel safe at home. Summary Adopting a healthy lifestyle and getting preventive care are important in promoting health and wellness. Follow your health care provider's instructions about healthy diet, exercising, and getting tested or screened for diseases. Follow your health care provider's instructions on monitoring your cholesterol and blood pressure. This information is not intended to replace advice given to you by your health care provider. Make sure you discuss any questions you have with your healthcare provider. Document Revised: 08/26/2018 Document Reviewed: 08/26/2018 Elsevier Patient Education  2022 [place].  Steps to Quit Smoking Smoking tobacco is the leading cause of preventable death. It can affect almost every organ in the body. Smoking puts you and people around you at risk for many serious, long-lasting (chronic) diseases. Quitting smoking can be  hard, but it is one of the best things thatyou can do for your health. It is never too late to quit. How do I get ready to quit? When you decide to quit smoking, make a plan to help you succeed. Before you quit: Pick a date to quit. Set a date within the next 2 weeks to give you time to prepare. Write down the reasons why you are quitting. Keep this list in places where you will see it often. Tell your family, friends, and co-workers that you are quitting. Their support is important. Talk with your doctor about the choices that may help you quit. Find out if your health insurance will pay for these treatments. Know the people, places, things, and activities that make  you want to smoke (triggers). Avoid them. What first steps can I take to quit smoking? Throw away all cigarettes at home, at work, and in your car. Throw away the things that you use when you smoke, such as ashtrays and lighters. Clean your car. Make sure to empty the ashtray. Clean your home, including curtains and carpets. What can I do to help me quit smoking? Talk with your doctor about taking medicines and seeing a counselor at the same time. You are more likely to succeed when you do both. If you are pregnant or breastfeeding, talk with your doctor about counseling or other ways to quit smoking. Do not take medicine to help you quit smoking unless your doctor tells you to do so. To quit smoking: Quit right away Quit smoking totally, instead of slowly cutting back on how much you smoke over a period of time. Go to counseling. You are more likely to quit if you go to counseling sessions regularly. Take medicine You may take medicines to help you quit. Some medicines need a prescription, and some you can buy over-the-counter. Some medicines may contain a drug called nicotine to replace the nicotine in cigarettes. Medicines may: Help you to stop having the desire to smoke (cravings). Help to stop the problems that come when you stop smoking (withdrawal symptoms). Your doctor may ask you to use: Nicotine patches, gum, or lozenges. Nicotine inhalers or sprays. Non-nicotine medicine that is taken by mouth. Find resources Find resources and other ways to help you quit smoking and remain smoke-free after you quit. These resources are most helpful when you use them often. They include: Online chats with a Social worker. Phone quitlines. Printed Furniture conservator/restorer. Support groups or group counseling. Text messaging programs. Mobile phone apps. Use apps on your mobile phone or tablet that can help you stick to your quit plan. There are many free apps for mobile phones and tablets as well as  websites. Examples include Quit Guide from the [employer] and smokefree.gov  What things can I do to make it easier to quit?  Talk to your family and friends. Ask them to support and encourage you. Call a phone quitline (1-800-QUIT-NOW), reach out to support groups, or work with a Social worker. Ask people who smoke to not smoke around you. Avoid places that make you want to smoke, such as: Bars. Parties. Smoke-break areas at work. Spend time with people who do not smoke. Lower the stress in your life. Stress can make you want to smoke. Try these things to help your stress: Getting regular exercise. Doing deep-breathing exercises. Doing yoga. Meditating. Doing a body scan. To do this, close your eyes, focus on one area of your body at a time from head to toe.  Notice which parts of your body are tense. Try to relax the muscles in those areas. How will I feel when I quit smoking? Day 1 to 3 weeks Within the first 24 hours, you may start to have some problems that come from quitting tobacco. These problems are very bad 2-3 days after you quit, but they do not often last for more than 2-3 weeks. You may get these symptoms: Mood swings. Feeling restless, nervous, angry, or annoyed. Trouble concentrating. Dizziness. Strong desire for high-sugar foods and nicotine. Weight gain. Trouble pooping (constipation). Feeling like you may vomit (nausea). Coughing or a sore throat. Changes in how the medicines that you take for other issues work in your body. Depression. Trouble sleeping (insomnia). Week 3 and afterward After the first 2-3 weeks of quitting, you may start to notice more positive results, such as: Better sense of smell and taste. Less coughing and sore throat. Slower heart rate. Lower blood pressure. Clearer skin. Better breathing. Fewer sick days. Quitting smoking can be hard. Do not give up if you fail the first time. Some people need to try a few times before they succeed. Do your  best to stick to your quit plan, and talk with yourdoctor if you have any questions or concerns. Summary Smoking tobacco is the leading cause of preventable death. Quitting smoking can be hard, but it is one of the best things that you can do for your health. When you decide to quit smoking, make a plan to help you succeed. Quit smoking right away, not slowly over a period of time. When you start quitting, seek help from your doctor, family, or friends. This information is not intended to replace advice given to you by your health care provider. Make sure you discuss any questions you have with your healthcare provider. Document Revised: 05/28/2019 Document Reviewed: 11/21/2018 Elsevier Patient Education  [place].

## 2021-04-03 NOTE — Progress Notes (Signed)
This AWV is being conducted by [place] only. The patient was located at home and I was located in [facility]. The patient's identity was confirmed using their DOB and current address. The patient or his/her legal guardian has consented to being evaluated through a telephone encounter and understands the associated risks (an examination cannot be done and the patient may need to come in for an appointment) / benefits (allows the patient to remain at home, decreasing exposure to coronavirus). I personally spent 35 minutes conducting the AWV.  Subjective:   Samuel Weiss is a 70 y.o. male who presents for a [employer] Visit.  The following items have been reviewed and updated today in the appropriate area in the EMR.   Health Risk Assessment  Height, weight, BMI, and BP Visual acuity if needed Depression screen Fall risk / safety level Advance directive discussion Medical and family history were reviewed and updated Updating list of other providers & suppliers Medication reconciliation, including over the counter medicines Cognitive screen Written screening schedule Risk Factor list Personalized health advice, risky behaviors, and treatment advice  Social History   Social History Narrative   Current Social History 04/02/2021        Patient lives with his sister " in a home which is 1 story/stories. There are 2 steps up to the entrance the patient uses.       Patient's method of transportation is personal car.      The highest level of education was college diploma.      The patient currently retired.      Identified important Relationships are his girlfriend       Pets : 0       Interests / Fun: "I shoot pool and go bowling every once in awhile."       Current Stressors: "I don't have any stress"       Religious / Personal Beliefs: "Wholist which is similar to baptist."    Cardiac Risk Factors include: advanced age (>29men, >2  women);dyslipidemia;hypertension;male gender;obesity (BMI >30kg/m2);smoking/ tobacco exposure    Objective:    Vitals: There were no vitals taken for this visit. Vitals are unable to obtained due to OIZTI-45 public health emergency  Activities of Daily Living In your present state of health, do you have any difficulty performing the following activities: 04/02/2021 12/01/2020  Hearing? N N  Vision? N N  Comment - -  Difficulty concentrating or making decisions? N N  Walking or climbing stairs? N N  Comment - -  Dressing or bathing? N N  Doing errands, shopping? N N  Preparing Food and eating ? N -  Using the Toilet? N -  In the past six months, have you accidently leaked urine? N -  Do you have problems with loss of bowel control? N -  Managing your Medications? N -  Managing your Finances? N -  Housekeeping or managing your Housekeeping? N -  Some recent data might be hidden    Goals  Goals      Exercise 5x per week (30 min per time)     "I am in the process of losing weight."  Has the silver sneakers program at the [facility] .      Quit Smoking     Patient is currently working towards smoking cessation. Declines any assistance with this and states he prefers to do this on his own.        Fall Risk Fall Risk  04/02/2021 12/01/2020 08/23/2020 05/29/2020 02/01/2020  Falls in the past year? 0 0 0 0 0  Number falls in past yr: 0 0 - - -  Injury with Fall? 0 0 - - -  Risk for fall due to : - - No Fall Risks No Fall Risks -  Risk for fall due to: Comment - - - - -  Follow up - - Falls prevention discussed Falls prevention discussed -    Depression Screen PHQ 2/9 Scores 04/02/2021 08/23/2020 05/29/2020 02/01/2020  PHQ - 2 Score 0 0 1 0  PHQ- 9 Score - - 2 1     Cognitive Testing Six-Item Cognitive Screener   "I would like to ask you some questions that ask you to use your memory. I am going to name three objects. Please wait until I say all three words, then repeat them.  Remember what they are  because I am going to ask you to name them again in a few minutes. Please repeat these words for me: APPLE--TABLE--PENNY." (Interviewer may repeat names 3 times if necessary but repetition not scored.)  Did patient correctly repeat all three words? Yes - may proceed with screen  What year is this? Correct What month is this? Correct What day of the week is this? Correct  What were the three objects I asked you to remember? Apple Correct Table Correct Penny Incorrect  Score one point for each incorrect answer.  A score of 2 or more points warrants additional investigation.  Patient's score 1   Assessment and Plan:    During the course of the visit the patient was educated and counseled about appropriate screening and preventive services as documented in the assessment and plan. Discussed with patient options for smoking cessation but patient declined and prefers to try himself. Praised patient for exercising and reminded patient of goal to complete 150 minutes of exercise weekly.  The printed AVS was given to the patient and included an updated screening schedule, a list of risk factors, and personalized health advice.        Hughes Better, RPH-CPP  04/03/2021

## 2021-04-04 NOTE — Progress Notes (Signed)
I discussed the AWV findings with the [facility] who conducted the visit. I was present in the office suite and immediately available to provide assistance and direction throughout the time the service was provided.   Sanjuan Dame, MD Internal Medicine PGY-2 [phone]

## 2021-04-23 ENCOUNTER — Encounter: Payer: Self-pay | Admitting: Internal Medicine

## 2021-04-23 ENCOUNTER — Ambulatory Visit (INDEPENDENT_AMBULATORY_CARE_PROVIDER_SITE_OTHER): Payer: Medicare Other | Admitting: Internal Medicine

## 2021-04-23 VITALS — BP 122/72 | HR 81 | Temp 98.1°F

## 2021-04-23 DIAGNOSIS — I10 Essential (primary) hypertension: Secondary | ICD-10-CM | POA: Diagnosis not present

## 2021-04-23 DIAGNOSIS — E785 Hyperlipidemia, unspecified: Secondary | ICD-10-CM | POA: Diagnosis not present

## 2021-04-23 DIAGNOSIS — R7303 Prediabetes: Secondary | ICD-10-CM

## 2021-04-23 DIAGNOSIS — F1090 Alcohol use, unspecified, uncomplicated: Secondary | ICD-10-CM

## 2021-04-23 DIAGNOSIS — Z23 Encounter for immunization: Secondary | ICD-10-CM | POA: Diagnosis not present

## 2021-04-23 DIAGNOSIS — M25569 Pain in unspecified knee: Secondary | ICD-10-CM | POA: Diagnosis not present

## 2021-04-23 DIAGNOSIS — Z7289 Other problems related to lifestyle: Secondary | ICD-10-CM | POA: Insufficient documentation

## 2021-04-23 DIAGNOSIS — E119 Type 2 diabetes mellitus without complications: Secondary | ICD-10-CM

## 2021-04-23 DIAGNOSIS — M25562 Pain in left knee: Secondary | ICD-10-CM

## 2021-04-23 DIAGNOSIS — Z789 Other specified health status: Secondary | ICD-10-CM

## 2021-04-23 DIAGNOSIS — G8929 Other chronic pain: Secondary | ICD-10-CM | POA: Insufficient documentation

## 2021-04-23 HISTORY — DX: Alcohol use, unspecified, uncomplicated: F10.90

## 2021-04-23 HISTORY — DX: Other chronic pain: G89.29

## 2021-04-23 LAB — POCT GLYCOSYLATED HEMOGLOBIN (HGB A1C): Hemoglobin A1C: 6.5 % — AB (ref 4.0–5.6)

## 2021-04-23 LAB — GLUCOSE, CAPILLARY: Glucose-Capillary: 110 mg/dL — ABNORMAL HIGH (ref 70–99)

## 2021-04-23 NOTE — Assessment & Plan Note (Addendum)
Patient reports making several lifestyle modifications. He has recently decreased his alcohol intake as well as improved his diet, cut down on sodium intake. He is also exercising more recently and walks at least a mile on the indoor track each morning.  BP 122/70 today. - Continue olmesartan '40mg'$ , amlodipine 10, HCTZ 12.5

## 2021-04-23 NOTE — Assessment & Plan Note (Addendum)
Patient reports that he has made several modifications to his lifestyle and diet. In addition, he report that he has been trying to exercise more as well as eat healthier. He reports a diet of boiled chicken and vegetable. He says that he doe not add much/if any butter.  Previous lipid panel within target range. - continue atorvastatin '40mg'$ .

## 2021-04-23 NOTE — Assessment & Plan Note (Addendum)
Patient reports that he has begun eating a healthier diet of chicken and vegetable. Reports he is trying to avoid soda/sugary food. Last a1c 6.3. A1c during 8/8 visit 6.5 Patient's blood sugar inadequately controlled with lifestyle modification alone and is now in diabetic range - Will start on Metformin '500mg'$  BID. Unable to reach patient by telephone, mychart message sent. - Continue to encourage healthy lifestyle.
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
## 2021-04-23 NOTE — Assessment & Plan Note (Addendum)
Patient reports that pain onset earlier as he was walking around the track. He states it resolves with rest and worsens with walking/weightbearing. He has not tried anything to help with the pain. Patient reports pain in area of pes anserine. Slight edema noted, no erythema. Not tender to palpation.  - Recommend Ice and ibuprofen. - avoid aggravating activities.

## 2021-04-23 NOTE — Assessment & Plan Note (Addendum)
Patient reports that he used to drink every day, however, he has recently cut back approximately a month ago. He states he only drinks occasionally now. He denies any problems with his drinking and does not believe it is negatively impactinh him in any way. He reports that when he does drink, he will consume a whole pint of hard liquor in 2 days.  - possible hepatomegaly palpated on PE. - No other signs or symptoms concerning for cirrhosis at this time.  - encouraged he cut down on use - will continue to monitor for now.

## 2021-04-23 NOTE — Patient Instructions (Addendum)
Dear Mr. Counterman,  Thank you for trusting me with your care.  Today we addressed the following concerns:  Hypertension Your blood pressure today was 122/22. This is a great blood pressure. - Continue taking olmesartan-amlodipine-hctz  Hyperlipidemia - Your most recent lipid panel showed no abnormalities.  - continue taking the atorvastatin '40mg'$ .  Prediabetes - your most recent A1c was 6.3.  - we will recheck your A1c today. - we will check your kidney function with the blood work and call you with the results.  Alcohol use - we drew blood to check your liver function. We will call you with these results.  Pneumonia vaccine - we administered the second pneumonia shot to you today.

## 2021-04-24 LAB — CMP14 + ANION GAP
ALT: 31 IU/L (ref 0–44)
AST: 27 IU/L (ref 0–40)
Albumin/Globulin Ratio: 1.8 (ref 1.2–2.2)
Albumin: 4.8 g/dL — ABNORMAL HIGH (ref 3.7–4.7)
Alkaline Phosphatase: 58 IU/L (ref 44–121)
Anion Gap: 18 mmol/L (ref 10.0–18.0)
BUN/Creatinine Ratio: 21 (ref 10–24)
BUN: 20 mg/dL (ref 8–27)
Bilirubin Total: 0.3 mg/dL (ref 0.0–1.2)
CO2: 20 mmol/L (ref 20–29)
Calcium: 9.9 mg/dL (ref 8.6–10.2)
Chloride: 104 mmol/L (ref 96–106)
Creatinine, Ser: 0.97 mg/dL (ref 0.76–1.27)
Globulin, Total: 2.7 g/dL (ref 1.5–4.5)
Glucose: 92 mg/dL (ref 65–99)
Potassium: 4.1 mmol/L (ref 3.5–5.2)
Sodium: 142 mmol/L (ref 134–144)
Total Protein: 7.5 g/dL (ref 6.0–8.5)
eGFR: 83 mL/min/{1.73_m2} (ref >59)

## 2021-04-30 ENCOUNTER — Encounter: Payer: Self-pay | Admitting: Internal Medicine

## 2021-04-30 MED ORDER — METFORMIN HCL 500 MG PO TABS: 500.0000 mg | ORAL_TABLET | Freq: Every day | ORAL | 3 refills | Status: DC

## 2021-04-30 NOTE — Progress Notes (Signed)
Internal Medicine Clinic Attending  I saw and evaluated the patient.  I personally confirmed the key portions of the history and exam documented by Dr. Gawaluck and I reviewed pertinent patient test results.  The assessment, diagnosis, and plan were formulated together and I agree with the documentation in the resident's note.  

## 2021-04-30 NOTE — Addendum Note (Signed)
Addended by: Delene Ruffini T on: 04/30/2021 11:15 AM   Modules accepted: Orders

## 2021-05-02 NOTE — Telephone Encounter (Signed)
Patient called in stating he received a call from his pharmacy that he had a med to p/u (Metformin). He states he did not receive this MyChart message and has many questions about new dx. Requesting referral to [facility], and call back from Provider to discuss this. 1 month f/u scheduled with PCP for 9/19 at 1:15.

## 2021-05-03 DIAGNOSIS — E1165 Type 2 diabetes mellitus with hyperglycemia: Secondary | ICD-10-CM | POA: Diagnosis not present

## 2021-05-19 ENCOUNTER — Ambulatory Visit: Payer: Medicare Other

## 2021-05-19 NOTE — Progress Notes (Deleted)
Subjective:  I connected with  Samuel Weiss on 05/19/21 by an audio only telemedicine application and verified that I am speaking with the correct person using two identifiers.   I discussed the limitations, risks, security and privacy concerns of performing an evaluation and management service by telephone and the availability of in person appointments. I also discussed with the patient that there may be a patient responsible charge related to this service. The patient expressed understanding and verbally consented to this telephonic visit.  Location of Patient:  Location of Provider:  List any persons and their role that are participating in the visit with the patient.    Review of Systems    Defer to PCP       Objective:    There were no vitals filed for this visit. There is no height or weight on file to calculate BMI.  Advanced Directives 04/23/2021 04/02/2021 08/23/2020 05/29/2020 02/01/2020 11/18/2019 10/05/2019  Does Patient Have a Medical Advance Directive? Yes Yes No No No No No  Does patient want to make changes to medical advance directive? No - Patient declined Yes (MAU/Ambulatory/Procedural Areas - Information given) - - - - -  Would patient like information on creating a medical advance directive? - - No - Patient declined No - Patient declined No - Patient declined No - Patient declined No - Patient declined    Current Medications (verified) Outpatient Encounter Medications as of 05/19/2021  Medication Sig   atorvastatin (LIPITOR) 40 MG tablet Take 1 tablet (40 mg total) by mouth daily.   metFORMIN (GLUCOPHAGE) 500 MG tablet Take 1 tablet (500 mg total) by mouth daily with breakfast.   Olmesartan-amLODIPine-HCTZ 40-10-12.5 MG TABS Take 1 tablet by mouth once daily   Omega-3 Fatty Acids (FISH OIL PO) Take by mouth.   No facility-administered encounter medications on file as of 05/19/2021.    Allergies (verified) Patient has no known allergies.   History: Past  Medical History:  Diagnosis Date   Hyperlipidemia    Hypertension    Multiple facial fractures, open, initial encounter ([facility]) 05/30/2018   Past Surgical History:  Procedure Laterality Date   CLOSED REDUCTION MANDIBLE N/A 05/30/2018   Procedure: CLOSED REDUCTION MANDIBULAR FRACTURE;  Surgeon: Michael Litter, DMD;  Location: [place];  Service: Oral Surgery;  Laterality: N/A;   ORIF FACIAL FRACTURE N/A 05/30/2018   Procedure: OPEN REDUCTION INTERNAL FIXATION (ORIF) MULTIPLE FACIAL FRACTURES;  Surgeon: Michael Litter, DMD;  Location: [place];  Service: Oral Surgery;  Laterality: N/A;   Family History  Problem Relation Age of Onset   Hypertension Mother    Diabetes Mother    Hypertension Father    Kidney disease Sister    Diabetes Sister    Obesity Sister    Diabetes Sister    Heart disease Sister    Kidney disease Sister    Diabetes Sister    Kidney disease Brother    Kidney disease Brother    Cancer Maternal Aunt    Social History   Socioeconomic History   Marital status: Single    Spouse name: Not on file   Number of children: 4   Years of education: Not on file   Highest education level: Bachelor's degree (e.g., BA, AB, BS)  Occupational History   Not on file  Tobacco Use   Smoking status: Some Days    Packs/day: 0.15    Types: Cigarettes   Smokeless tobacco: Current  Vaping Use   Vaping Use: Never used  Substance and Sexual Activity   Alcohol use: Yes    Alcohol/week: 3.0 standard drinks    Types: 3 Cans of beer per week   Drug use: Not Currently    Types: Marijuana   Sexual activity: Not Currently  Other Topics Concern   Not on file  Social History Narrative   Current Social History 04/02/2021        Patient lives with his sister " in a home which is 1 story/stories. There are 2 steps up to the entrance the patient uses.       Patient's method of transportation is personal car.      The highest level of education was college diploma.      The patient currently  retired.      Identified important Relationships are his girlfriend       Pets : 0       Interests / Fun: "I shoot pool and go bowling every once in awhile."       Current Stressors: "I don't have any stress"       Religious / Personal Beliefs: "[employer] which is similar to [employer]   Social Determinants of Health   Financial Resource Strain: Low Risk    Difficulty of Paying Living Expenses: Not very hard  Food Insecurity: No Food Insecurity   Worried About Charity fundraiser in the Last Year: Never true   Ran Out of Food in the Last Year: Never true  Transportation Needs: No Transportation Needs   Lack of Transportation (Medical): No   Lack of Transportation (Non-Medical): No  Physical Activity: Insufficiently Active   Days of Exercise per Week: 3 days   Minutes of Exercise per Session: 30 min  Stress: No Stress Concern Present   Feeling of Stress : Not at all  Social Connections: Moderately Integrated   Frequency of Communication with Friends and Family: More than three times a week   Frequency of Social Gatherings with Friends and Family: Twice a week   Attends Religious Services: 1 to 4 times per year   Active Member of [employer] or Organizations: No   Attends Music therapist: Not on file   Marital Status: Living with partner    Tobacco Counseling Ready to quit: Not Answered Counseling given: Not Answered   Clinical Intake:                 Diabetic?***         Activities of Daily Living In your present state of health, do you have any difficulty performing the following activities: 04/23/2021 04/02/2021  Hearing? N N  Vision? N N  Comment - -  Difficulty concentrating or making decisions? N N  Walking or climbing stairs? Y N  Comment acute knee pain-left -  Dressing or bathing? N N  Doing errands, shopping? N N  Preparing Food and eating ? - N  Using the Toilet? - N  In the past six months, have you accidently leaked urine? - N  Do  you have problems with loss of bowel control? - N  Managing your Medications? - N  Managing your Finances? - N  Housekeeping or managing your Housekeeping? - N  Some recent data might be hidden    Patient Care Team: Mitzi Hansen, MD as PCP - General  Indicate any recent Medical Services you may have received from other than [facility] providers in the past year (date may be approximate).     Assessment:  This is a routine wellness examination for [place].  Hearing/Vision screen No results found.  Dietary issues and exercise activities discussed:     Goals Addressed   None    Depression Screen PHQ 2/9 Scores 04/23/2021 04/02/2021 08/23/2020 05/29/2020 02/01/2020 10/05/2019 07/06/2019  PHQ - 2 Score 0 0 0 1 0 0 0  PHQ- 9 Score 2 - - 2 1 - 1    Fall Risk Fall Risk  04/23/2021 04/02/2021 12/01/2020 08/23/2020 05/29/2020  Falls in the past year? 0 0 0 0 0  Number falls in past yr: 0 0 0 - -  Injury with Fall? 0 0 0 - -  Risk for fall due to : Other (Comment) - - No Fall Risks No Fall Risks  Risk for fall due to: Comment knee pain-left - - - -  Follow up Falls evaluation completed - - Falls prevention discussed Falls prevention discussed    FALL RISK PREVENTION PERTAINING TO THE HOME:  Any stairs in or around the home? {YES/NO:21197} If so, are there any without handrails? {YES/NO:21197} Home free of loose throw rugs in walkways, pet beds, electrical cords, etc? {YES/NO:21197} Adequate lighting in your home to reduce risk of falls? {YES/NO:21197}  ASSISTIVE DEVICES UTILIZED TO PREVENT FALLS:  Life alert? {YES/NO:21197} Use of a cane, walker or w/c? {YES/NO:21197} Grab bars in the bathroom? {YES/NO:21197} Shower chair or bench in shower? {YES/NO:21197} Elevated toilet seat or a handicapped toilet? {YES/NO:21197}  TIMED UP AND GO:  Was the test performed? {YES/NO:21197}.  Length of time to ambulate 10 feet: *** sec.   {Appearance of [id number]  Cognitive Function:      6CIT Screen 04/02/2021  What Year? 0 points  What month? 0 points  What time? 0 points  Count back from 20 0 points  Months in reverse 0 points  Repeat phrase 2 points  Total Score 2    Immunizations Immunization History  Administered Date(s) Administered   Influenza, High Dose Seasonal PF 05/31/2018   Influenza,inj,Quad PF,6+ Mos 10/20/2015, 07/06/2019, 05/29/2020   PFIZER(Purple Top)SARS-COV-2 Vaccination 11/07/2019, 12/01/2019   PNEUMOCOCCAL CONJUGATE-20 04/23/2021   Pneumococcal Conjugate-13 07/06/2019    {TDAP status:2101805}  {Flu Vaccine status:2101806}  {Pneumococcal vaccine status:2101807}  {Covid-19 vaccine status:2101808}  Qualifies for Shingles Vaccine? {YES/NO:21197}  Zostavax completed {YES/NO:21197}  {Shingrix Completed?:2101804}  Screening Tests Health Maintenance  Topic Date Due   FOOT EXAM  Never done   OPHTHALMOLOGY EXAM  Never done   TETANUS/TDAP  Never done   Zoster Vaccines- Shingrix (1 of 2) Never done   COVID-19 Vaccine (3 - Booster for Pfizer series) 05/02/2020   INFLUENZA VACCINE  04/16/2021   HEMOGLOBIN A1C  10/24/2021   PNA vac Low Risk Adult (2 of 2 - PPSV23) 04/23/2022   COLONOSCOPY (Pts 45-26yr Insurance coverage will need to be confirmed)  06/20/2030   Hepatitis C Screening  Completed   HPV VACCINES  Aged Out    Health Maintenance  Health Maintenance Due  Topic Date Due   FOOT EXAM  Never done   OPHTHALMOLOGY EXAM  Never done   TETANUS/TDAP  Never done   Zoster Vaccines- Shingrix (1 of 2) Never done   COVID-19 Vaccine (3 - Booster for Pfizer series) 05/02/2020   INFLUENZA VACCINE  04/16/2021    {Colorectal cancer screening:2101809}  Lung Cancer Screening: (Low Dose CT Chest recommended if Age 9-80 years, 30 pack-year currently smoking OR have quit w/in 15years.) {DOES NOT does:27190::"does not"} qualify.   Lung Cancer Screening Referral: ***  Additional  Screening:  Hepatitis C Screening: {DOES NOT does:27190::"does  not"} qualify; Completed ***  Vision Screening: Recommended annual ophthalmology exams for early detection of glaucoma and other disorders of the eye. Is the patient up to date with their annual eye exam?  {YES/NO:21197} Who is the provider or what is the name of the office in which the patient attends annual eye exams? *** If pt is not established with a provider, would they like to be referred to a provider to establish care? {YES/NO:21197}.   Dental Screening: Recommended annual dental exams for proper oral hygiene  Community Resource Referral / Chronic Care Management: CRR required this visit?  {YES/NO:21197}  CCM required this visit?  {YES/NO:21197}     Plan:     I have personally reviewed and noted the following in the patient's chart:   Medical and social history Use of alcohol, tobacco or illicit drugs  Current medications and supplements including opioid prescriptions. {Opioid Prescriptions:[phone]} Functional ability and status Nutritional status Physical activity Advanced directives List of other physicians Hospitalizations, surgeries, and ER visits in previous 12 months Vitals Screenings to include cognitive, depression, and falls Referrals and appointments  In addition, I have reviewed and discussed with patient certain preventive protocols, quality metrics, and best practice recommendations. A written personalized care plan for preventive services as well as general preventive health recommendations were provided to patient.     Lauralyn Primes, [place]   05/19/2021   Nurse Notes: ***

## 2021-05-21 ENCOUNTER — Ambulatory Visit: Payer: Medicare Other

## 2021-05-23 ENCOUNTER — Ambulatory Visit: Payer: Medicare Other

## 2021-05-23 NOTE — Progress Notes (Unsigned)
Subjective:  I connected with  Samuel Weiss on 05/23/21 by an audio only telemedicine application and verified that I am speaking with the correct person using two identifiers.   I discussed the limitations, risks, security and privacy concerns of performing an evaluation and management service by telephone and the availability of in person appointments. I also discussed with the patient that there may be a patient responsible charge related to this service. The patient expressed understanding and verbally consented to this telephonic visit.  Location of Patient: Home Location of Provider: Office  List any persons and their role that are participating in the visit with the patient.   Review of Systems    Defer to PCP       Objective:    There were no vitals filed for this visit. There is no height or weight on file to calculate BMI.  Advanced Directives 05/23/2021 04/23/2021 04/02/2021 08/23/2020 05/29/2020 02/01/2020 11/18/2019  Does Patient Have a Medical Advance Directive? Yes Yes Yes No No No No  Does patient want to make changes to medical advance directive? - No - Patient declined Yes (MAU/Ambulatory/Procedural Areas - Information given) - - - -  Would patient like information on creating a medical advance directive? - - - No - Patient declined No - Patient declined No - Patient declined No - Patient declined    Current Medications (verified) Outpatient Encounter Medications as of 05/23/2021  Medication Sig   atorvastatin (LIPITOR) 40 MG tablet Take 1 tablet (40 mg total) by mouth daily.   metFORMIN (GLUCOPHAGE) 500 MG tablet Take 1 tablet (500 mg total) by mouth daily with breakfast.   Olmesartan-amLODIPine-HCTZ 40-10-12.5 MG TABS Take 1 tablet by mouth once daily   Omega-3 Fatty Acids (FISH OIL PO) Take by mouth.   No facility-administered encounter medications on file as of 05/23/2021.    Allergies (verified) Patient has no known allergies.   History: Past Medical History:   Diagnosis Date   Hyperlipidemia    Hypertension    Multiple facial fractures, open, initial encounter ([place]) 05/30/2018   Past Surgical History:  Procedure Laterality Date   CLOSED REDUCTION MANDIBLE N/A 05/30/2018   Procedure: CLOSED REDUCTION MANDIBULAR FRACTURE;  Surgeon: Michael Litter, DMD;  Location: [place];  Service: Oral Surgery;  Laterality: N/A;   ORIF FACIAL FRACTURE N/A 05/30/2018   Procedure: OPEN REDUCTION INTERNAL FIXATION (ORIF) MULTIPLE FACIAL FRACTURES;  Surgeon: Michael Litter, DMD;  Location: [place];  Service: Oral Surgery;  Laterality: N/A;   Family History  Problem Relation Age of Onset   Hypertension Mother    Diabetes Mother    Hypertension Father    Kidney disease Sister    Diabetes Sister    Obesity Sister    Diabetes Sister    Heart disease Sister    Kidney disease Sister    Diabetes Sister    Kidney disease Brother    Kidney disease Brother    Cancer Maternal Aunt    Social History   Socioeconomic History   Marital status: Single    Spouse name: Not on file   Number of children: 4   Years of education: Not on file   Highest education level: Bachelor's degree (e.g., BA, AB, BS)  Occupational History   Not on file  Tobacco Use   Smoking status: Some Days    Packs/day: 0.15    Types: Cigarettes   Smokeless tobacco: Current  Vaping Use   Vaping Use: Never used  Substance and Sexual  Activity   Alcohol use: Yes    Alcohol/week: 3.0 standard drinks    Types: 3 Cans of beer per week   Drug use: Not Currently    Types: Marijuana   Sexual activity: Not Currently  Other Topics Concern   Not on file  Social History Narrative   Current Social History 04/02/2021        Patient lives with his sister " in a home which is 1 story/stories. There are 2 steps up to the entrance the patient uses.       Patient's method of transportation is personal car.      The highest level of education was college diploma.      The patient currently retired.       Identified important Relationships are his girlfriend       Pets : 0       Interests / Fun: "I shoot pool and go bowling every once in awhile."       Current Stressors: "I don't have any stress"       Religious / Personal Beliefs: "[employer] which is similar to [employer]   Social Determinants of Health   Financial Resource Strain: Low Risk    Difficulty of Paying Living Expenses: Not very hard  Food Insecurity: No Food Insecurity   Worried About Charity fundraiser in the Last Year: Never true   Ran Out of Food in the Last Year: Never true  Transportation Needs: No Transportation Needs   Lack of Transportation (Medical): No   Lack of Transportation (Non-Medical): No  Physical Activity: Insufficiently Active   Days of Exercise per Week: 3 days   Minutes of Exercise per Session: 30 min  Stress: No Stress Concern Present   Feeling of Stress : Not at all  Social Connections: Moderately Isolated   Frequency of Communication with Friends and Family: More than three times a week   Frequency of Social Gatherings with Friends and Family: Three times a week   Attends Religious Services: More than 4 times per year   Active Member of Clubs or Organizations: No   Attends Archivist Meetings: Never   Marital Status: Never married    Tobacco Counseling Ready to quit: Not Answered Counseling given: Not Answered   Clinical Intake:  Pre-visit preparation completed: Yes  Pain : No/denies pain        How often do you need to have someone help you when you read instructions, pamphlets, or other written materials from your doctor or pharmacy?: 1 - Never  Diabetic?No , Pt is Pre diabetic  Interpreter Needed?: No      Activities of Daily Living In your present state of health, do you have any difficulty performing the following activities: 05/23/2021 04/23/2021  Hearing? N N  Vision? N N  Comment - -  Difficulty concentrating or making decisions? N N  Walking or climbing  stairs? N Y  Comment - acute knee pain-left  Dressing or bathing? N N  Doing errands, shopping? N N  Preparing Food and eating ? N -  Using the Toilet? N -  In the past six months, have you accidently leaked urine? N -  Do you have problems with loss of bowel control? N -  Managing your Medications? N -  Managing your Finances? N -  Housekeeping or managing your Housekeeping? N -  Some recent data might be hidden    Patient Care Team: Mitzi Hansen, MD as PCP - General  Indicate any recent Medical Services you may have received from other than [facility] providers in the past year (date may be approximate).     Assessment:   This is a routine wellness examination for [place].  Hearing/Vision screen No results found.  Dietary issues and exercise activities discussed:     Goals Addressed   None    Depression Screen PHQ 2/9 Scores 05/23/2021 04/23/2021 04/02/2021 08/23/2020 05/29/2020 02/01/2020 10/05/2019  PHQ - 2 Score 0 0 0 0 1 0 0  PHQ- 9 Score 0 2 - - 2 1 -    Fall Risk Fall Risk  05/23/2021 04/23/2021 04/02/2021 12/01/2020 08/23/2020  Falls in the past year? 0 0 0 0 0  Number falls in past yr: 0 0 0 0 -  Injury with Fall? 0 0 0 0 -  Risk for fall due to : - Other (Comment) - - No Fall Risks  Risk for fall due to: Comment - knee pain-left - - -  Follow up - Falls evaluation completed - - Falls prevention discussed    FALL RISK PREVENTION PERTAINING TO THE HOME:  Any stairs in or around the home? Yes  If so, are there any without handrails? Yes  Home free of loose throw rugs in walkways, pet beds, electrical cords, etc? No  Adequate lighting in your home to reduce risk of falls? Yes   ASSISTIVE DEVICES UTILIZED TO PREVENT FALLS:  Life alert? No  Use of a cane, walker or w/c? No  Grab bars in the bathroom? No  Shower chair or bench in shower? No  Elevated toilet seat or a handicapped toilet? No   TIMED UP AND GO:  Was the test performed?  N/A .  Length of time to ambulate  10 feet: N/A sec.   Unable to perform test via telephone visit.  Cognitive Function:     6CIT Screen 04/02/2021  What Year? 0 points  What month? 0 points  What time? 0 points  Count back from 20 0 points  Months in reverse 0 points  Repeat phrase 2 points  Total Score 2    Immunizations Immunization History  Administered Date(s) Administered   Influenza, High Dose Seasonal PF 05/31/2018   Influenza,inj,Quad PF,6+ Mos 10/20/2015, 07/06/2019, 05/29/2020   PFIZER(Purple Top)SARS-COV-2 Vaccination 11/07/2019, 12/01/2019   PNEUMOCOCCAL CONJUGATE-20 04/23/2021   Pneumococcal Conjugate-13 07/06/2019    TDAP status: Due, Education has been provided regarding the importance of this vaccine. Advised may receive this vaccine at local pharmacy or Health Dept. Aware to provide a copy of the vaccination record if obtained from local pharmacy or Health Dept. Verbalized acceptance and understanding.  Flu Vaccine status: Due, Education has been provided regarding the importance of this vaccine. Advised may receive this vaccine at local pharmacy or Health Dept. Aware to provide a copy of the vaccination record if obtained from local pharmacy or Health Dept. Verbalized acceptance and understanding.  Pneumococcal vaccine status: Up to date  Covid-19 vaccine status: Completed vaccines  Qualifies for Shingles Vaccine? Yes   Zostavax completed No   Shingrix Completed?: No.    Education has been provided regarding the importance of this vaccine. Patient has been advised to call insurance company to determine out of pocket expense if they have not yet received this vaccine. Advised may also receive vaccine at local pharmacy or Health Dept. Verbalized acceptance and understanding.  Screening Tests Health Maintenance  Topic Date Due   FOOT EXAM  Never done   OPHTHALMOLOGY EXAM  Never done   TETANUS/TDAP  Never done   Zoster Vaccines- Shingrix (1 of 2) Never done   COVID-19 Vaccine (3 - Booster for  Pfizer series) 05/02/2020   INFLUENZA VACCINE  04/16/2021   HEMOGLOBIN A1C  10/24/2021   PNA vac Low Risk Adult (2 of 2 - PPSV23) 04/23/2022   COLONOSCOPY (Pts 45-62yr Insurance coverage will need to be confirmed)  06/20/2030   Hepatitis C Screening  Completed   HPV VACCINES  Aged Out    Health Maintenance  Health Maintenance Due  Topic Date Due   FOOT EXAM  Never done   OPHTHALMOLOGY EXAM  Never done   TETANUS/TDAP  Never done   Zoster Vaccines- Shingrix (1 of 2) Never done   COVID-19 Vaccine (3 - Booster for Pfizer series) 05/02/2020   INFLUENZA VACCINE  04/16/2021    Colorectal cancer screening: Type of screening: Colonoscopy. Completed 06/20/2020. Repeat every 10 years  Lung Cancer Screening: (Low Dose CT Chest recommended if Age 39100-80years, 30 pack-year currently smoking OR have quit w/in 15years.) does qualify.   Lung Cancer Screening Referral: Yes  Additional Screening:  Hepatitis C Screening: does not qualify; Completed 09/18/2016  Vision Screening: Recommended annual ophthalmology exams for early detection of glaucoma and other disorders of the eye. Is the patient up to date with their annual eye exam?  Yes  Who is the provider or what is the name of the office in which the patient attends annual eye exams? [facility] If pt is not established with a provider, would they like to be referred to a provider to establish care? No .   Dental Screening: Recommended annual dental exams for proper oral hygiene  Community Resource Referral / Chronic Care Management: CRR required this visit?  {YES/NO:21197}  CCM required this visit?  {YES/NO:21197}     Plan:     I have personally reviewed and noted the following in the patient's chart:   Medical and social history Use of alcohol, tobacco or illicit drugs  Current medications and supplements including opioid prescriptions. Patient is not currently taking opioid prescriptions. Functional ability and  status Nutritional status Physical activity Advanced directives List of other physicians Hospitalizations, surgeries, and ER visits in previous 12 months Vitals Screenings to include cognitive, depression, and falls Referrals and appointments  In addition, I have reviewed and discussed with patient certain preventive protocols, quality metrics, and best practice recommendations. A written personalized care plan for preventive services as well as general preventive health recommendations were provided to patient.     TLauralyn Primes RMA   05/23/2021   Nurse Notes: Non-Face to Face 45 minute visit Encounter   Mr. BStefka, Thank you for taking time to come for your Medicare Wellness Visit. I appreciate your ongoing commitment to your health goals. Please review the following plan we discussed and let me know if I can assist you in the future.   These are the goals we discussed:  Goals      Exercise 5x per week (30 min per time)     "I am in the process of losing weight."  [facility]      Quit Smoking     Patient is currently working towards smoking cessation. Declines any assistance with this and states he prefers to do this on his own.        This is a list of the screening recommended for you and due dates:  Health Maintenance  Topic Date Due   Complete foot exam   Never done   Eye exam for diabetics  Never done   Tetanus Vaccine  Never done   Zoster (Shingles) Vaccine (1 of 2) Never done   COVID-19 Vaccine (3 - Booster for Pfizer series) 05/02/2020   Flu Shot  04/16/2021   Hemoglobin A1C  10/24/2021   Pneumonia vaccines (2 of 2 - PPSV23) 04/23/2022   Colon Cancer Screening  06/20/2030   Hepatitis C Screening: USPSTF Recommendation to screen - Ages 18-79 yo.  Completed   HPV Vaccine  Aged Out

## 2021-06-04 ENCOUNTER — Other Ambulatory Visit: Payer: Self-pay

## 2021-06-04 ENCOUNTER — Ambulatory Visit (INDEPENDENT_AMBULATORY_CARE_PROVIDER_SITE_OTHER): Payer: Medicare Other | Admitting: Internal Medicine

## 2021-06-04 ENCOUNTER — Ambulatory Visit (HOSPITAL_COMMUNITY)
Admission: RE | Admit: 2021-06-04 | Discharge: 2021-06-04 | Disposition: A | Discharge status: Home / Self Care | Payer: Medicare Other | Source: Ambulatory Visit | Attending: Internal Medicine | Admitting: Internal Medicine

## 2021-06-04 ENCOUNTER — Encounter: Payer: Self-pay | Admitting: Internal Medicine

## 2021-06-04 VITALS — BP 137/72 | HR 87 | Temp 98.3°F | Ht 67.5 in | Wt 216.1 lb

## 2021-06-04 DIAGNOSIS — Z6833 Body mass index (BMI) 33.0-33.9, adult: Secondary | ICD-10-CM

## 2021-06-04 DIAGNOSIS — M1712 Unilateral primary osteoarthritis, left knee: Secondary | ICD-10-CM | POA: Diagnosis not present

## 2021-06-04 DIAGNOSIS — G8929 Other chronic pain: Secondary | ICD-10-CM

## 2021-06-04 DIAGNOSIS — Z7289 Other problems related to lifestyle: Secondary | ICD-10-CM | POA: Diagnosis not present

## 2021-06-04 DIAGNOSIS — E785 Hyperlipidemia, unspecified: Secondary | ICD-10-CM | POA: Diagnosis not present

## 2021-06-04 DIAGNOSIS — I10 Essential (primary) hypertension: Secondary | ICD-10-CM

## 2021-06-04 DIAGNOSIS — Z789 Other specified health status: Secondary | ICD-10-CM

## 2021-06-04 DIAGNOSIS — F17200 Nicotine dependence, unspecified, uncomplicated: Secondary | ICD-10-CM

## 2021-06-04 DIAGNOSIS — E119 Type 2 diabetes mellitus without complications: Secondary | ICD-10-CM

## 2021-06-04 DIAGNOSIS — M25562 Pain in left knee: Secondary | ICD-10-CM

## 2021-06-04 DIAGNOSIS — E6609 Other obesity due to excess calories: Secondary | ICD-10-CM

## 2021-06-04 MED ORDER — OLMESARTAN-AMLODIPINE-HCTZ 40-10-12.5 MG PO TABS: ORAL_TABLET | ORAL | 0 refills | Status: DC

## 2021-06-04 NOTE — Assessment & Plan Note (Signed)
Currently smoking--1 pack lasts him roughly 3-4 days. Encouraged smoking cessation.

## 2021-06-04 NOTE — Patient Instructions (Addendum)
Mr.Kaylum J Shiller, it was a pleasure seeing you today!  Today we discussed:  Hypertension: your blood pressure is just slightly above where we would like to see it. I would like to see that top number <198mHg. We will continue the current dose of your medication and recheck it next time you come in.  Diabetes: Continue taking the metformin for now. You can look into Ozempic which is the once weekly injection. If you would like to to switch to this, just let me know!  Consider the shingles shot in the near future.  Follow-up: January 2023  Please make sure to arrive 15 minutes prior to your next appointment. If you arrive late, you may be asked to reschedule.   We look forward to seeing you next time. Please call our clinic at [phone] you have any questions or concerns. The best time to call is Monday-Friday from 9am-4pm, but there is someone available 24/7. If after hours or the weekend, call the main hospital number and ask for the Internal Medicine Resident On-Call. If you need medication refills, please notify your pharmacy one week in advance and they will send [facility] request.  Thank you for letting [facility] part in your care. Wishing you the best!

## 2021-06-04 NOTE — Assessment & Plan Note (Signed)
He was started on metformin at his last office visit with one of my colleagues last month due to his A1C increasing 6.3>6.5 . He has been taking 500mg  daily since then without adverse effects.  He mentions to me that he would also like to work on some weight loss so we discussed switching to ozempic which would help both his diabetes and assist with weight loss. He is a little hesitant to switch today due to it requiring weekly injections but is interested and will think about it.  He is also improving his diet and increasing physical activity which I commended him for.  Plan:   He will continue metformin for now and will let me know if he would like to switch to ozempic.   Follow up in January 2023 with me; foot exam at that visit  He has an optometry exam scheduled in November. Encouraged him to notify them of this new diabetes diagnosis so that they perform retinopathy screening at that time.

## 2021-06-04 NOTE — Progress Notes (Signed)
AWV performed by Lonie Peak and reviewed by Dr. Collene Gobble is also reviewed by attending.

## 2021-06-04 NOTE — Assessment & Plan Note (Addendum)
Current medications: olmesartan-amlodipine-hctz 40-10-12.5 Initial blood pressure reading was elevated but improved on repeat with use of proper sized cuff--137/72. Discussed with him that we would ideally like to see systolic pressures <[id number]. He is hesitant to increase the hctz due to hx of dehydration in the past resulting in syncope.  Reviewed labs from August.  Plan  Continue current management  Follow up + labs in January 2023 with me. Can consider increasing the hctz component if it remains elevated at his next office visit with me.

## 2021-06-05 ENCOUNTER — Encounter: Payer: Self-pay | Admitting: Internal Medicine

## 2021-06-05 DIAGNOSIS — E6609 Other obesity due to excess calories: Secondary | ICD-10-CM | POA: Insufficient documentation

## 2021-06-05 NOTE — Assessment & Plan Note (Signed)
He has been improving his diet and increasing physical activity since our last visit. He is down around 4lb since March but would like to lose even more. We discussed the option of switching from metformin to ozempic. He would like to consider this and will reach our to our office if he decides to proceed with this.

## 2021-06-05 NOTE — Progress Notes (Signed)
Office Visit   Patient ID: Samuel Weiss, male    DOB: [birth year], 70 y.o.   MRN: [id number]   PCP: Mitzi Hansen, MD   Subjective:  Samuel Weiss is a 70 y.o. year old male who presents for follow up of chronic medical conditions including hypertension, diabetes. Please refer to problem based charting for assessment and plan.   ACTIVE MEDICATIONS   Outpatient Medications Prior to Visit  Medication Sig   atorvastatin (LIPITOR) 40 MG tablet Take 1 tablet (40 mg total) by mouth daily.   metFORMIN (GLUCOPHAGE) 500 MG tablet Take 1 tablet (500 mg total) by mouth daily with breakfast.   Omega-3 Fatty Acids (FISH OIL PO) Take by mouth.   [DISCONTINUED] Olmesartan-amLODIPine-HCTZ 40-10-12.5 MG TABS Take 1 tablet by mouth once daily   No facility-administered medications prior to visit.     Objective:   BP 137/72 (BP Location: Left Arm, Patient Position: Sitting, Cuff Size: Large)   Pulse 87   Temp 98.3 F (36.8 C) (Oral)   Ht 5' 7.5" (1.715 m)   Wt 216 lb 1.6 oz (98 kg)   SpO2 98%   BMI 33.35 kg/m  Wt Readings from Last 3 Encounters:  06/04/21 216 lb 1.6 oz (98 kg)  12/01/20 220 lb 14.4 oz (100.2 kg)  08/23/20 217 lb 4.8 oz (98.6 kg)    BP Readings from Last 3 Encounters:  06/04/21 137/72  04/23/21 122/72  12/01/20 (!) 144/82   General: well appearing male Cardiac: RRR, no lower extremity edema Pulm: lungs clear throughout Left knee: no obvious deformity. No erythema or effusion. No pain on palpation.  Lower extremities: trace LE edema bilaterally. No pain on palpation of the lower legs.  Health Maintenance:   Health Maintenance  Topic Date Due   FOOT EXAM  Never done   OPHTHALMOLOGY EXAM  Never done   Zoster Vaccines- Shingrix (1 of 2) Never done   COVID-19 Vaccine (3 - Booster for Pfizer series) 05/02/2020   HEMOGLOBIN A1C  10/24/2021   COLONOSCOPY (Pts 45-76yrs Insurance coverage will need to be confirmed)  06/20/2030   TETANUS/TDAP  06/05/2031    INFLUENZA VACCINE  Completed   Hepatitis C Screening  Completed   HPV VACCINES  Aged Out    Assessment & Plan:   Problem List Items Addressed This Visit       Cardiovascular and Mediastinum   Essential hypertension (Chronic)    Current medications: olmesartan-amlodipine-hctz 40-10-12.5 Initial blood pressure reading was elevated but improved on repeat with use of proper sized cuff--137/72. Discussed with him that we would ideally like to see systolic pressures <[id number]. He is hesitant to increase the hctz due to hx of dehydration in the past resulting in syncope.  Reviewed labs from August.  Plan Continue current management Follow up + labs in January 2023 with me. Can consider increasing the hctz component if it remains elevated at his next office visit with me.       Relevant Medications   Olmesartan-amLODIPine-HCTZ 40-10-12.5 MG TABS     Endocrine   Diabetes mellitus ([place])    He was started on metformin at his last office visit with one of my colleagues last month due to his A1C increasing 6.3>6.5 . He has been taking 500mg  daily since then without adverse effects.  He mentions to me that he would also like to work on some weight loss so we discussed switching to ozempic which would help both his diabetes and assist with weight loss.  He is a little hesitant to switch today due to it requiring weekly injections but is interested and will think about it.  He is also improving his diet and increasing physical activity which I commended him for.  Plan:  He will continue metformin for now and will let me know if he would like to switch to ozempic.  Follow up in January 2023 with me; foot exam at that visit He has an optometry exam scheduled in November. Encouraged him to notify them of this new diabetes diagnosis so that they perform retinopathy screening at that time.       Relevant Medications   Olmesartan-amLODIPine-HCTZ 40-10-12.5 MG TABS     Other   HLD (hyperlipidemia)  (Chronic)   Relevant Medications   Olmesartan-amLODIPine-HCTZ 40-10-12.5 MG TABS   Tobacco use disorder (Chronic)    Currently smoking--1 pack lasts him roughly 3-4 days. Encouraged smoking cessation.       Chronic pain of left knee - Primary    HPI: Pain has persisted since his last office visit and is affecting his ability to remain active. Pain is exacerbated by activity and improves with rest. He says he has been trying some sort of cream on it without relief. Pain is primarily located on the medial and lateral aspects along the joint line. Denies clicking or locking sensation.  Exam: no pain on palpation, no erythema or effusion Assessment: suspect pes anserine bursitis vs osteoarthritis. DVT considered unlikely--Well's score 0. No recent traumatic injury to suggest ligament or meniscus injury. Discussed above ddx. Used shared decision making to guide further workup vs ongoing supportive treatment. He would like to obtain an xray to evaluate for osteoarthritis which I think is reasonable.  Plan Left knee xray If osteoarthritis is present, will discuss a steroid injection If no evidence of osteoarthritis is present, then would recommend ongoing supportive therapy and to reduce physical activity. He may benefit from PT for quadriceps strengthening as well. If symptoms persist, can offer referral to sports medicine.       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 He is down around 4lb since March but would like to lose even more. We discussed the option of switching from metformin to ozempic. He would like to consider this and will reach our to our office if he decides to proceed with this.         Return in about 4 months (around 09/20/2021) for Follow up of chronic medical conditions with  Dr. Darrick Meigs.   Pt discussed with Dr. Marty Heck, MD Internal Medicine Resident PGY-3 [facility] 06/05/2021 8:14 AM

## 2021-06-05 NOTE — Assessment & Plan Note (Addendum)
HPI: Pain has persisted since his last office visit and is affecting his ability to remain active. Pain is exacerbated by activity and improves with rest. He says he has been trying some sort of cream on it without relief. Pain is primarily located on the medial and lateral aspects along the joint line. Denies clicking or locking sensation.  Exam: no pain on palpation, no erythema or effusion Assessment: suspect pes anserine bursitis vs osteoarthritis. DVT considered unlikely--Well's score 0. No recent traumatic injury to suggest ligament or meniscus injury. Discussed above ddx. Used shared decision making to guide further workup vs ongoing supportive treatment. He would like to obtain an xray to evaluate for osteoarthritis which I think is reasonable.  Plan  Left knee xray  If osteoarthritis is present, will discuss a steroid injection  If no evidence of osteoarthritis is present, then would recommend ongoing supportive therapy and to reduce physical activity. He may benefit from PT for quadriceps strengthening as well. If symptoms persist, can offer referral to sports medicine.

## 2021-06-05 NOTE — Progress Notes (Signed)
Internal Medicine Clinic Attending  Case discussed with Dr. Christian  At the time of the visit.  We reviewed the resident's history and exam and pertinent patient test results.  I agree with the assessment, diagnosis, and plan of care documented in the resident's note.  

## 2021-06-06 MED ORDER — ATORVASTATIN CALCIUM 40 MG PO TABS: 40.0000 mg | ORAL_TABLET | Freq: Every day | ORAL | 1 refills | Status: DC

## 2021-06-06 NOTE — Addendum Note (Signed)
Addended by: Mitzi Hansen on: 06/06/2021 04:46 PM   Modules accepted: Orders

## 2021-08-28 ENCOUNTER — Other Ambulatory Visit: Payer: Self-pay | Admitting: Internal Medicine

## 2021-09-11 ENCOUNTER — Other Ambulatory Visit: Payer: Self-pay | Admitting: Internal Medicine

## 2021-09-11 DIAGNOSIS — E785 Hyperlipidemia, unspecified: Secondary | ICD-10-CM

## 2021-09-18 LAB — HM DIABETES EYE EXAM

## 2021-09-27 ENCOUNTER — Ambulatory Visit (INDEPENDENT_AMBULATORY_CARE_PROVIDER_SITE_OTHER): Payer: Medicare Other | Admitting: Internal Medicine

## 2021-09-27 ENCOUNTER — Encounter: Payer: Self-pay | Admitting: Internal Medicine

## 2021-09-27 VITALS — BP 132/70 | HR 86 | Temp 98.1°F | Wt 221.8 lb

## 2021-09-27 DIAGNOSIS — I10 Essential (primary) hypertension: Secondary | ICD-10-CM

## 2021-09-27 DIAGNOSIS — Z6833 Body mass index (BMI) 33.0-33.9, adult: Secondary | ICD-10-CM

## 2021-09-27 DIAGNOSIS — E119 Type 2 diabetes mellitus without complications: Secondary | ICD-10-CM | POA: Diagnosis not present

## 2021-09-27 DIAGNOSIS — F17200 Nicotine dependence, unspecified, uncomplicated: Secondary | ICD-10-CM

## 2021-09-27 DIAGNOSIS — E785 Hyperlipidemia, unspecified: Secondary | ICD-10-CM | POA: Diagnosis not present

## 2021-09-27 DIAGNOSIS — E6609 Other obesity due to excess calories: Secondary | ICD-10-CM

## 2021-09-27 LAB — POCT GLYCOSYLATED HEMOGLOBIN (HGB A1C): Hemoglobin A1C: 6.4 % — AB (ref 4.0–5.6)

## 2021-09-27 LAB — GLUCOSE, CAPILLARY: Glucose-Capillary: 107 mg/dL — ABNORMAL HIGH (ref 70–99)

## 2021-09-27 MED ORDER — OZEMPIC (1 MG/DOSE) 4 MG/3ML ~~LOC~~ SOPN: 1.0000 mg | PEN_INJECTOR | SUBCUTANEOUS | 1 refills | Status: DC

## 2021-09-27 MED ORDER — OZEMPIC (0.25 OR 0.5 MG/DOSE) 2 MG/1.5ML ~~LOC~~ SOPN: PEN_INJECTOR | SUBCUTANEOUS | 0 refills | Status: DC

## 2021-09-27 MED ORDER — ATORVASTATIN CALCIUM 40 MG PO TABS: 40.0000 mg | ORAL_TABLET | Freq: Every day | ORAL | 1 refills | Status: DC

## 2021-09-27 MED ORDER — OLMESARTAN-AMLODIPINE-HCTZ 40-10-12.5 MG PO TABS: 1.0000 | ORAL_TABLET | Freq: Every day | ORAL | 0 refills | Status: DC

## 2021-09-27 MED ORDER — METFORMIN HCL 500 MG PO TABS: 500.0000 mg | ORAL_TABLET | Freq: Every day | ORAL | 1 refills | Status: DC

## 2021-09-28 ENCOUNTER — Encounter: Payer: Self-pay | Admitting: Internal Medicine

## 2021-09-28 NOTE — Assessment & Plan Note (Signed)
Current medications: olmesartan-amlodipine-hctz 40-10-12.5 Blood pressure is at goal--132/70. Plan  Continue current management  BMP today  F/u 3 months

## 2021-09-28 NOTE — Assessment & Plan Note (Addendum)
Current medications: metformin 500mg  daily, lipitor 40mg  daily Denies adverse medication effects. Reports medication adherence, congruent with dispense report. Reports that he underwent diabetic retinopathy screening in November which was unremarkable. A1C is 6.4 today. Plan:   Add ozempic. Titrate up to 1mg , will assess if he needs additional adjustments at next office visit.  Referred to [facility] for med teaching  Continue metformin  We are awaiting offical report from retinopathy screening, repeat in 1 year  Diabetic foot exam performed at today's visit  Follow up in 3 months

## 2021-09-28 NOTE — Progress Notes (Signed)
Office Visit   Patient ID: Samuel Weiss, male    DOB: August 20, 1951, 71 y.o.   MRN: [id number]   PCP: Mitzi Hansen, MD   Subjective:   Samuel Weiss is a 71 y.o. year old male who presents for follow up of chronic medical conditions including type 2 diabetes, hypertension, and tobacco use disorder.   LOV with me was in September 2022. No medications at that time. No interm events since that time.   ACTIVE MEDICATIONS   Outpatient Medications Prior to Visit  Medication Sig   Omega-3 Fatty Acids (FISH OIL PO) Take by mouth.   [DISCONTINUED] atorvastatin (LIPITOR) 40 MG tablet TAKE 1 TABLET BY MOUTH EVERY DAY   [DISCONTINUED] metFORMIN (GLUCOPHAGE) 500 MG tablet Take 1 tablet by mouth once daily with breakfast   [DISCONTINUED] Olmesartan-amLODIPine-HCTZ 40-10-12.5 MG TABS Take 1 tablet by mouth once daily   No facility-administered medications prior to visit.     Objective:   BP 132/70 (BP Location: Left Arm, Patient Position: Sitting, Cuff Size: Normal)    Pulse 86    Temp 98.1 F (36.7 C) (Oral)    Wt 221 lb 12.8 oz (100.6 kg)    SpO2 99%    BMI 34.23 kg/m  Wt Readings from Last 3 Encounters:  09/27/21 221 lb 12.8 oz (100.6 kg)  06/04/21 216 lb 1.6 oz (98 kg)  12/01/20 220 lb 14.4 oz (100.2 kg)    BP Readings from Last 3 Encounters:  09/27/21 132/70  06/04/21 137/72  04/23/21 122/72   General: well appearing, no distress Cardiac: RRR, no LE edema Pulm: lungs clear throughout  Health Maintenance:   Health Maintenance  Topic Date Due   COVID-19 Vaccine (3 - Booster for Pfizer series) 01/26/2020   Zoster Vaccines- Shingrix (1 of 2) 12/26/2021 (Originally 12/05/1998)   HEMOGLOBIN A1C  03/27/2022   OPHTHALMOLOGY EXAM  07/26/2022   FOOT EXAM  09/27/2022   COLONOSCOPY (Pts 45-57yrs Insurance coverage will need to be confirmed)  06/20/2030   TETANUS/TDAP  06/05/2031   Pneumonia Vaccine 44+ Years old  Completed   INFLUENZA VACCINE  Completed   Hepatitis C  Screening  Completed   HPV VACCINES  Aged Out    Assessment & Plan:   Problem List Items Addressed This Visit       Cardiovascular and Mediastinum   Essential hypertension - Primary (Chronic)    Current medications: olmesartan-amlodipine-hctz 40-10-12.5 Blood pressure is at goal--132/70. Plan Continue current management BMP today F/u 3 months      Diabetes mellitus ([place])    Current medications: metformin 500mg  daily, lipitor 40mg  daily Denies adverse medication effects. Reports medication adherence, congruent with dispense report. Reports that he underwent diabetic retinopathy screening in November which was unremarkable. A1C is 6.4 today. Plan:  Add ozempic. Titrate up to 1mg , will assess if he needs additional adjustments at next office visit. Referred to [facility] for med teaching Continue metformin We are awaiting offical report from retinopathy screening, repeat in 1 year Diabetic foot exam performed at today's visit Follow up in 3 months      Tobacco use disorder (Chronic)    Currently smoking 1-3 cigarettes daily. Continue to encourage smoking cessation.      Class 1 obesity due to excess calories with body mass index (BMI) of 33.0 to 33.9 in adult    Weight is up about 5 pounds from September. He admits to decreased physical activity over the past few months. We had discussed ozempic at his  LOV in September at which time he wanted time to think about it. At today's visit, he would like to proceed with this.        Return in about 3 months (around 12/26/2021).   Pt discussed with Dr. Forde Radon, MD Internal Medicine Resident PGY-3 [facility] 09/28/2021 5:08 PM

## 2021-09-28 NOTE — Assessment & Plan Note (Addendum)
Currently smoking 1-3 cigarettes daily. He is not interested in medical management for smoking cessation. Continue to encourage smoking cessation.

## 2021-09-28 NOTE — Assessment & Plan Note (Signed)
Weight is up about 5 pounds from September. He admits to decreased physical activity over the past few months. We had discussed ozempic at his LOV in September at which time he wanted time to think about it. At today's visit, he would like to proceed with this.

## 2021-09-29 LAB — BASIC METABOLIC PANEL
BUN/Creatinine Ratio: 20 (ref 10–24)
BUN: 20 mg/dL (ref 8–27)
CO2: 22 mmol/L (ref 20–29)
Calcium: 9.8 mg/dL (ref 8.6–10.2)
Chloride: 104 mmol/L (ref 96–106)
Creatinine, Ser: 0.99 mg/dL (ref 0.76–1.27)
Glucose: 96 mg/dL (ref 70–99)
Potassium: 4.2 mmol/L (ref 3.5–5.2)
Sodium: 144 mmol/L (ref 134–144)
eGFR: 81 mL/min/{1.73_m2} (ref >59)

## 2021-10-03 NOTE — Progress Notes (Signed)
Internal Medicine Clinic Attending  Case discussed with Dr. Christian  At the time of the visit.  We reviewed the resident's history and exam and pertinent patient test results.  I agree with the assessment, diagnosis, and plan of care documented in the resident's note.  

## 2021-11-06 ENCOUNTER — Telehealth: Payer: Self-pay | Admitting: *Deleted

## 2021-11-06 NOTE — Telephone Encounter (Signed)
Patient here on 11/05/2021 c/o not knowing how to administer his Ozempic.  Patent was shown how to administer his Ozempic through Video and teaching.  Was able to administer dosage for the week.  Will return on Monday for additional teaching with Debera Lat, the Diabetes Educator.

## 2021-11-13 ENCOUNTER — Other Ambulatory Visit: Payer: Self-pay | Admitting: Internal Medicine

## 2021-11-13 ENCOUNTER — Telehealth: Payer: Self-pay | Admitting: Dietician

## 2021-11-13 ENCOUNTER — Encounter: Payer: Self-pay | Admitting: Dietician

## 2021-11-13 ENCOUNTER — Ambulatory Visit (INDEPENDENT_AMBULATORY_CARE_PROVIDER_SITE_OTHER): Payer: Medicare Other | Admitting: Dietician

## 2021-11-13 DIAGNOSIS — E119 Type 2 diabetes mellitus without complications: Secondary | ICD-10-CM | POA: Diagnosis not present

## 2021-11-13 NOTE — Telephone Encounter (Signed)
Confirmed appointment

## 2021-11-13 NOTE — Patient Instructions (Addendum)
Thank you for your visit. I suggest we follow up on the same  day you see your doctor.   You said your goal is to stop smoking.   Continue to work on exercise.   [facility] [phone]  Ask if they take your insurance and ask how much it will cost you?   Diabetes Mellitus and Nutrition, Adult When you have diabetes, or diabetes mellitus, it is very important to have healthy eating habits because your blood sugar (glucose) levels are greatly affected by what you eat and drink. Eating healthy foods in the right amounts, at about the same times every day, can help you: Manage your blood glucose. Lower your risk of heart disease. Improve your blood pressure. Reach or maintain a healthy weight. What can affect my meal plan? Every person with diabetes is different, and each person has different needs for a meal plan. Your health care provider may recommend that you work with a dietitian to make a meal plan that is best for you. Your meal plan may vary depending on factors such as: The calories you need. The medicines you take. Your weight. Your blood glucose, blood pressure, and cholesterol levels. Your activity level. Other health conditions you have, such as heart or kidney disease. How do carbohydrates affect me? Carbohydrates, also called carbs, affect your blood glucose level more than any other type of food. Eating carbs raises the amount of glucose in your blood. It is important to know how many carbs you can safely have in each meal. This is different for every person. Your dietitian can help you calculate how many carbs you should have at each meal and for each snack. How does alcohol affect me? Alcohol can cause a decrease in blood glucose (hypoglycemia), especially if you use insulin or take certain diabetes medicines by mouth. Hypoglycemia can be a life-threatening condition. Symptoms of hypoglycemia, such as sleepiness, dizziness, and confusion, are similar to symptoms of having  too much alcohol. Do not drink alcohol if: Your health care provider tells you not to drink. You are pregnant, may be pregnant, or are planning to become pregnant. If you drink alcohol: Limit how much you have to: 0-1 drink a day for women. 0-2 drinks a day for men. Know how much alcohol is in your drink. In the [place] one drink equals one 12 oz bottle of beer (355 mL), one 5 oz glass of wine (148 mL), or one 1 oz glass of hard liquor (44 mL). Keep yourself hydrated with water, diet soda, or unsweetened iced tea. Keep in mind that regular soda, juice, and other mixers may contain a lot of sugar and must be counted as carbs. What are tips for following this plan? Reading food labels Start by checking the serving size on the Nutrition Facts label of packaged foods and drinks. The number of calories and the amount of carbs, fats, and other nutrients listed on the label are based on one serving of the item. Many items contain more than one serving per package. Check the total grams (g) of carbs in one serving. Check the number of grams of saturated fats and trans fats in one serving. Choose foods that have a low amount or none of these fats. Check the number of milligrams (mg) of salt (sodium) in one serving. Most people should limit total sodium intake to less than 2,300 mg per day. Always check the nutrition information of foods labeled as "low-fat" or "nonfat." These foods may be higher in  added sugar or refined carbs and should be avoided. Talk to your dietitian to identify your daily goals for nutrients listed on the label. Shopping Avoid buying canned, pre-made, or processed foods. These foods tend to be high in fat, sodium, and added sugar. Shop around the outside edge of the grocery store. This is where you will most often find fresh fruits and vegetables, bulk grains, fresh meats, and fresh dairy products. Cooking Use low-heat cooking methods, such as baking, instead of high-heat cooking  methods, such as deep frying. Cook using healthy oils, such as olive, canola, or sunflower oil. Avoid cooking with butter, cream, or high-fat meats. Meal planning Eat meals and snacks regularly, preferably at the same times every day. Avoid going long periods of time without eating. Eat foods that are high in fiber, such as fresh fruits, vegetables, beans, and whole grains. Eat 4-6 oz (112-168 g) of lean protein each day, such as lean meat, chicken, fish, eggs, or tofu. One ounce (oz) (28 g) of lean protein is equal to: 1 oz (28 g) of meat, chicken, or fish. 1 egg.  cup (62 g) of tofu. Eat some foods each day that contain healthy fats, such as avocado, nuts, seeds, and fish. What foods should I eat? Fruits Berries. Apples. Oranges. Peaches. Apricots. Plums. Grapes. Mangoes. Papayas. Pomegranates. Kiwi. Cherries. Vegetables Leafy greens, including lettuce, spinach, kale, chard, collard greens, mustard greens, and cabbage. Beets. Cauliflower. Broccoli. Carrots. Green beans. Tomatoes. Peppers. Onions. Cucumbers. Brussels sprouts. Grains Whole grains, such as whole-wheat or whole-grain bread, crackers, tortillas, cereal, and pasta. Unsweetened oatmeal. Quinoa. Brown or wild rice. Meats and other proteins Seafood. Poultry without skin. Lean cuts of poultry and beef. Tofu. Nuts. Seeds. Dairy Low-fat or fat-free dairy products such as milk, yogurt, and cheese. The items listed above may not be a complete list of foods and beverages you can eat and drink. Contact a dietitian for more information. What foods should I avoid? Fruits Fruits canned with syrup. Vegetables Canned vegetables. Frozen vegetables with butter or cream sauce. Grains Refined white flour and flour products such as bread, pasta, snack foods, and cereals. Avoid all processed foods. Meats and other proteins Fatty cuts of meat. Poultry with skin. Breaded or fried meats. Processed meat. Avoid saturated fats. Dairy Full-fat  yogurt, cheese, or milk. Beverages Sweetened drinks, such as soda or iced tea. The items listed above may not be a complete list of foods and beverages you should avoid. Contact a dietitian for more information. Questions to ask a health care provider Do I need to meet with a certified diabetes care and education specialist? Do I need to meet with a dietitian? What number can I call if I have questions? When are the best times to check my blood glucose? Where to find more information: American Diabetes Association: diabetes.org Academy of Nutrition and Dietetics: eatright.[employer] of Diabetes and Digestive and Kidney Diseases: [employer] Association of Diabetes Care & Education Specialists: diabeteseducator.org Summary It is important to have healthy eating habits because your blood sugar (glucose) levels are greatly affected by what you eat and drink. It is important to use alcohol carefully. A healthy meal plan will help you manage your blood glucose and lower your risk of heart disease. Your health care provider may recommend that you work with a dietitian to make a meal plan that is best for you. This information is not intended to replace advice given to you by your health care provider. Make sure you discuss any  questions you have with your health care provider. Document Revised: 04/05/2020 Document Reviewed: 04/05/2020 Elsevier Patient Education  [place].

## 2021-11-13 NOTE — Progress Notes (Signed)
Diabetes Self-Management Education  Visit Type: First/Initial  Appt. Start Time: 915 Appt. End Time: 1010  11/13/2021  [name], identified by name and date of birth, is a 71 y.o. male with a diagnosis of Diabetes: Type 2.   ASSESSMENT  Mr. Kortz is happy with his weight loss and realizes this may help his diabetes. He is going to a gym twice a week with intention to increase.   Weight 213 lb (96.6 kg). Body mass index is 32.87 kg/m. Wt Readings from Last 10 Encounters:  11/13/21 213 lb (96.6 kg)  09/27/21 221 lb 12.8 oz (100.6 kg)  06/04/21 216 lb 1.6 oz (98 kg)  12/01/20 220 lb 14.4 oz (100.2 kg)  08/23/20 217 lb 4.8 oz (98.6 kg)  05/29/20 218 lb 1.6 oz (98.9 kg)  02/01/20 221 lb 4.8 oz (100.4 kg)  11/18/19 222 lb 3.2 oz (100.8 kg)  10/05/19 221 lb 12.8 oz (100.6 kg)  07/06/19 220 lb 1.6 oz (99.8 kg)   Lab Results  Component Value Date   HGBA1C 6.4 (A) 09/27/2021   HGBA1C 6.5 (A) 04/23/2021   HGBA1C 6.3 (A) 12/01/2020   HGBA1C 6.4 (A) 05/29/2020   HGBA1C 6.2 (A) 02/01/2020      Diabetes Self-Management Education - 11/13/21 0900       Visit Information   Visit Type First/Initial      Initial Visit   Diabetes Type Type 2    Are you currently following a meal plan? No   "not a healthy eater"   Are you taking your medications as prescribed? Yes    Date Diagnosed 2022      Health Coping   How would you rate your overall health? Good      Psychosocial Assessment   Patient Belief/Attitude about Diabetes Motivated to manage diabetes    Self-care barriers None    Self-management support Doctor's office;Friends;CDE visits;Family    Patient Concerns Medication    Special Needs None    Preferred Learning Style No preference indicated    Learning Readiness Ready    How often do you need to have someone help you when you read instructions, pamphlets, or other written materials from your doctor or pharmacy? 1 - Never    What is the last grade level you  completed in school? 16      Pre-Education Assessment   Patient understands the diabetes disease and treatment process. Needs Instruction    Patient understands incorporating nutritional management into lifestyle. Needs Instruction    Patient undertands incorporating physical activity into lifestyle. Demonstrates understanding / competency    Patient understands using medications safely. Needs Instruction    Patient understands monitoring blood glucose, interpreting and using results Needs Instruction    Patient understands prevention, detection, and treatment of acute complications. Needs Instruction    Patient understands prevention, detection, and treatment of chronic complications. Demonstrates understanding / competency    Patient understands how to develop strategies to address psychosocial issues. Demonstrates understanding / competency    Patient understands how to develop strategies to promote health/change behavior. Demonstrates understanding / competency      Complications   Last HgB A1C per patient/outside source 6.4 %    How often do you check your blood sugar? 0 times/day (not testing)    Number of hyperglycemic episodes per week 0    Have you had a dilated eye exam in the past 12 months? Yes   [facility] on [address] january 2023- no diabetes eye disease  Have you had a dental exam in the past 12 months? No    Are you checking your feet? Yes    How many days per week are you checking your feet? 5   [facility], has two calluses, plans to go to [facility] again     Dietary Intake   Breakfast banana, grits or cornflakes, milk, soda    Lunch skips or snacks on peanut butter cracker,apples and oranges, grapes    Dinner 5-6 PM pork or chicken wings, beans, peas, cabbage, greens, potato salad    Beverage(s) soda, milk. water, 2 cans of sopda per day. cranberry, apple sauce      Exercise   Exercise Type Light (walking / raking leaves);Moderate (swimming / aerobic walking);ADL's    How  many days per week to you exercise? 45    How many minutes per day do you exercise? 2    Total minutes per week of exercise 90      Patient Education   Previous Diabetes Education No    Disease state  Factors that contribute to the development of diabetes;Definition of diabetes, type 1 and 2, and the diagnosis of diabetes    Nutrition management  Role of diet in the treatment of diabetes and the relationship between the three main macronutrients and blood glucose level    Medications Taught/reviewed insulin injection, site rotation, insulin storage and needle disposal.   taught ozempic injection   Monitoring Yearly dilated eye exam;Daily foot exams;Identified appropriate SMBG and/or A1C goals.    Acute complications Discussed and identified patients' treatment of hyperglycemia.      Individualized Goals (developed by patient)   Reducing Risk stop smoking      Outcomes   Expected Outcomes Demonstrated interest in learning. Expect positive outcomes    Future DMSE 4-6 wks    Program Status Not Completed             Individualized Plan for Diabetes Self-Management Training:   Learning Objective:  Patient will have a greater understanding of diabetes self-management. Patient education plan is to attend individual and/or group sessions per assessed needs and concerns.   Plan:   Patient Instructions  Thank you for your visit. I suggest we follow up on the same  day you see your doctor.   You said your goal is to stop smoking.   Continue to work on exercise.   [facility] [phone]  Ask if they take your insurance and ask how much it will cost you?   Diabetes Mellitus and Nutrition, Adult When you have diabetes, or diabetes mellitus, it is very important to have healthy eating habits because your blood sugar (glucose) levels are greatly affected by what you eat and drink. Eating healthy foods in the right amounts, at about the same times every day, can help you: Manage your  blood glucose. Lower your risk of heart disease. Improve your blood pressure. Reach or maintain a healthy weight. What can affect my meal plan? Every person with diabetes is different, and each person has different needs for a meal plan. Your health care provider may recommend that you work with a dietitian to make a meal plan that is best for you. Your meal plan may vary depending on factors such as: The calories you need. The medicines you take. Your weight. Your blood glucose, blood pressure, and cholesterol levels. Your activity level. Other health conditions you have, such as heart or kidney disease. How do carbohydrates affect me? Carbohydrates, also called  carbs, affect your blood glucose level more than any other type of food. Eating carbs raises the amount of glucose in your blood. It is important to know how many carbs you can safely have in each meal. This is different for every person. Your dietitian can help you calculate how many carbs you should have at each meal and for each snack. How does alcohol affect me? Alcohol can cause a decrease in blood glucose (hypoglycemia), especially if you use insulin or take certain diabetes medicines by mouth. Hypoglycemia can be a life-threatening condition. Symptoms of hypoglycemia, such as sleepiness, dizziness, and confusion, are similar to symptoms of having too much alcohol. Do not drink alcohol if: Your health care provider tells you not to drink. You are pregnant, may be pregnant, or are planning to become pregnant. If you drink alcohol: Limit how much you have to: 0-1 drink a day for women. 0-2 drinks a day for men. Know how much alcohol is in your drink. In the [place] one drink equals one 12 oz bottle of beer (355 mL), one 5 oz glass of wine (148 mL), or one 1 oz glass of hard liquor (44 mL). Keep yourself hydrated with water, diet soda, or unsweetened iced tea. Keep in mind that regular soda, juice, and other mixers may contain a  lot of sugar and must be counted as carbs. What are tips for following this plan? Reading food labels Start by checking the serving size on the Nutrition Facts label of packaged foods and drinks. The number of calories and the amount of carbs, fats, and other nutrients listed on the label are based on one serving of the item. Many items contain more than one serving per package. Check the total grams (g) of carbs in one serving. Check the number of grams of saturated fats and trans fats in one serving. Choose foods that have a low amount or none of these fats. Check the number of milligrams (mg) of salt (sodium) in one serving. Most people should limit total sodium intake to less than 2,300 mg per day. Always check the nutrition information of foods labeled as "low-fat" or "nonfat." These foods may be higher in added sugar or refined carbs and should be avoided. Talk to your dietitian to identify your daily goals for nutrients listed on the label. Shopping Avoid buying canned, pre-made, or processed foods. These foods tend to be high in fat, sodium, and added sugar. Shop around the outside edge of the grocery store. This is where you will most often find fresh fruits and vegetables, bulk grains, fresh meats, and fresh dairy products. Cooking Use low-heat cooking methods, such as baking, instead of high-heat cooking methods, such as deep frying. Cook using healthy oils, such as olive, canola, or sunflower oil. Avoid cooking with butter, cream, or high-fat meats. Meal planning Eat meals and snacks regularly, preferably at the same times every day. Avoid going long periods of time without eating. Eat foods that are high in fiber, such as fresh fruits, vegetables, beans, and whole grains. Eat 4-6 oz (112-168 g) of lean protein each day, such as lean meat, chicken, fish, eggs, or tofu. One ounce (oz) (28 g) of lean protein is equal to: 1 oz (28 g) of meat, chicken, or fish. 1 egg.  cup (62 g) of  tofu. Eat some foods each day that contain healthy fats, such as avocado, nuts, seeds, and fish. What foods should I eat? Fruits Berries. Apples. Oranges. Peaches. Apricots. Plums. Grapes. Mangoes.  Papayas. Pomegranates. Kiwi. Cherries. Vegetables Leafy greens, including lettuce, spinach, kale, chard, collard greens, mustard greens, and cabbage. Beets. Cauliflower. Broccoli. Carrots. Green beans. Tomatoes. Peppers. Onions. Cucumbers. Brussels sprouts. Grains Whole grains, such as whole-wheat or whole-grain bread, crackers, tortillas, cereal, and pasta. Unsweetened oatmeal. Quinoa. Brown or wild rice. Meats and other proteins Seafood. Poultry without skin. Lean cuts of poultry and beef. Tofu. Nuts. Seeds. Dairy Low-fat or fat-free dairy products such as milk, yogurt, and cheese. The items listed above may not be a complete list of foods and beverages you can eat and drink. Contact a dietitian for more information. What foods should I avoid? Fruits Fruits canned with syrup. Vegetables Canned vegetables. Frozen vegetables with butter or cream sauce. Grains Refined white flour and flour products such as bread, pasta, snack foods, and cereals. Avoid all processed foods. Meats and other proteins Fatty cuts of meat. Poultry with skin. Breaded or fried meats. Processed meat. Avoid saturated fats. Dairy Full-fat yogurt, cheese, or milk. Beverages Sweetened drinks, such as soda or iced tea. The items listed above may not be a complete list of foods and beverages you should avoid. Contact a dietitian for more information. Questions to ask a health care provider Do I need to meet with a certified diabetes care and education specialist? Do I need to meet with a dietitian? What number can I call if I have questions? When are the best times to check my blood glucose? Where to find more information: American Diabetes Association: diabetes.org Academy of Nutrition and Dietetics:  eatright.[employer] of Diabetes and Digestive and Kidney Diseases: [employer]is Association of Diabetes Care & Education Specialists: diabeteseducator.org Summary It is important to have healthy eating habits because your blood sugar (glucose) levels are greatly affected by what you eat and drink. It is important to use alcohol carefully. A healthy meal plan will help you manage your blood glucose and lower your risk of heart disease. Your health care provider may recommend that you work with a dietitian to make a meal plan that is best for you. This information is not intended to replace advice given to you by your health care provider. Make sure you discuss any questions you have with your health care provider. Document Revised: 04/05/2020 Document Reviewed: 04/05/2020 Elsevier Patient Education  [place].     Expected Outcomes:  Demonstrated interest in learning. Expect positive outcomes  Education material provided: ADA - How to Thrive: A Guide for Your Journey with Diabetes and Diabetes Resources  If problems or questions, patient to contact team via:  Phone  Future DSME appointment: 4-6 wks Debera Lat, RD 11/13/2021 10:36 AM.

## 2021-11-26 ENCOUNTER — Other Ambulatory Visit: Payer: Self-pay | Admitting: Internal Medicine

## 2021-12-02 IMAGING — DX DG KNEE COMPLETE 4+V*L*
4 series · 4 of 4 positions shown · non-contrast
Comparison: No recent prior.

CLINICAL DATA: Left knee pain.

EXAM:
LEFT KNEE - COMPLETE 4+ VIEW

[knee ap]
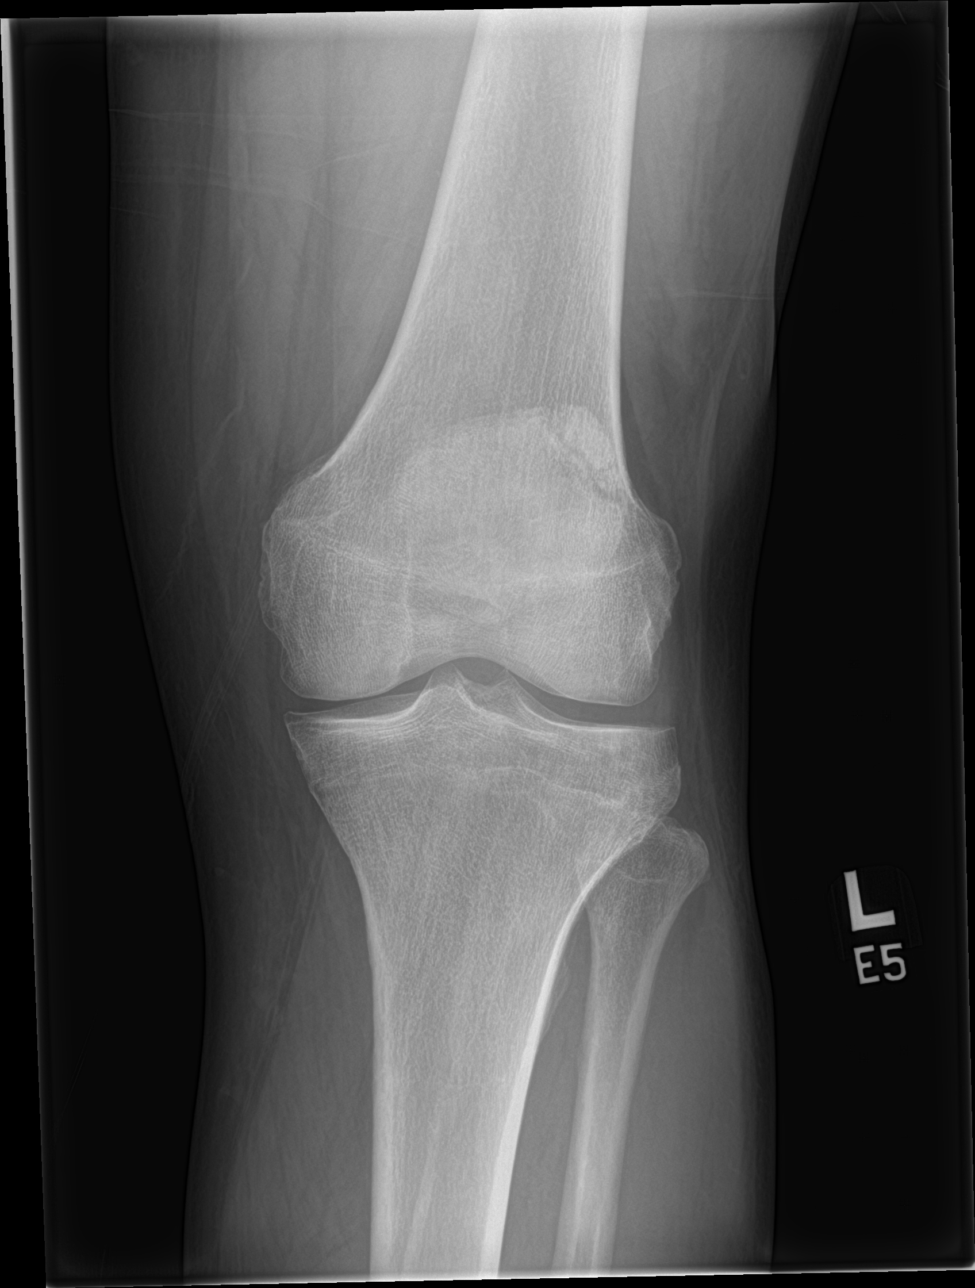

[knee obl (1 of 2)]
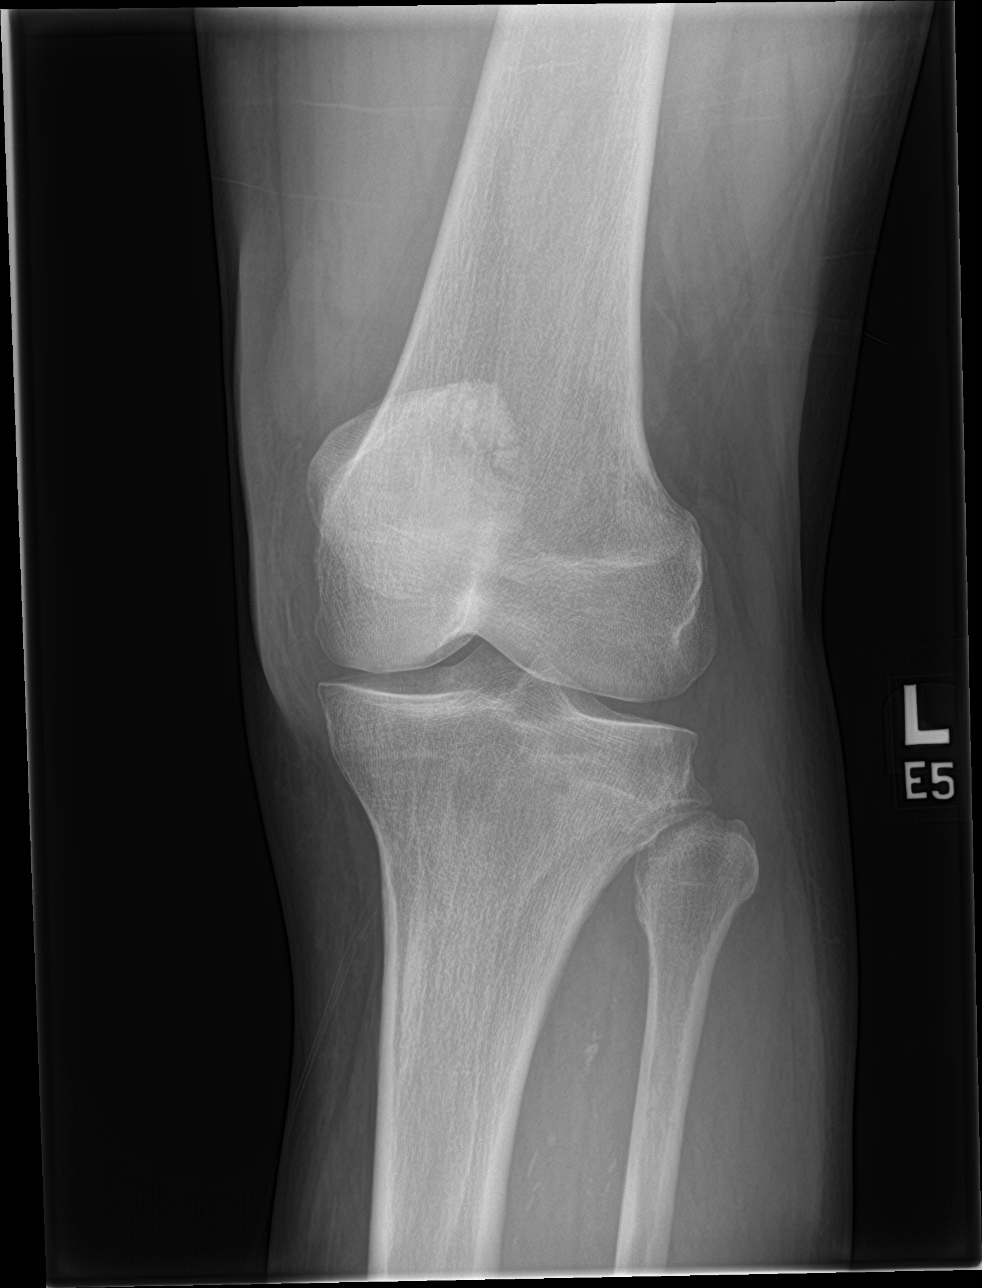

[knee obl (2 of 2)]
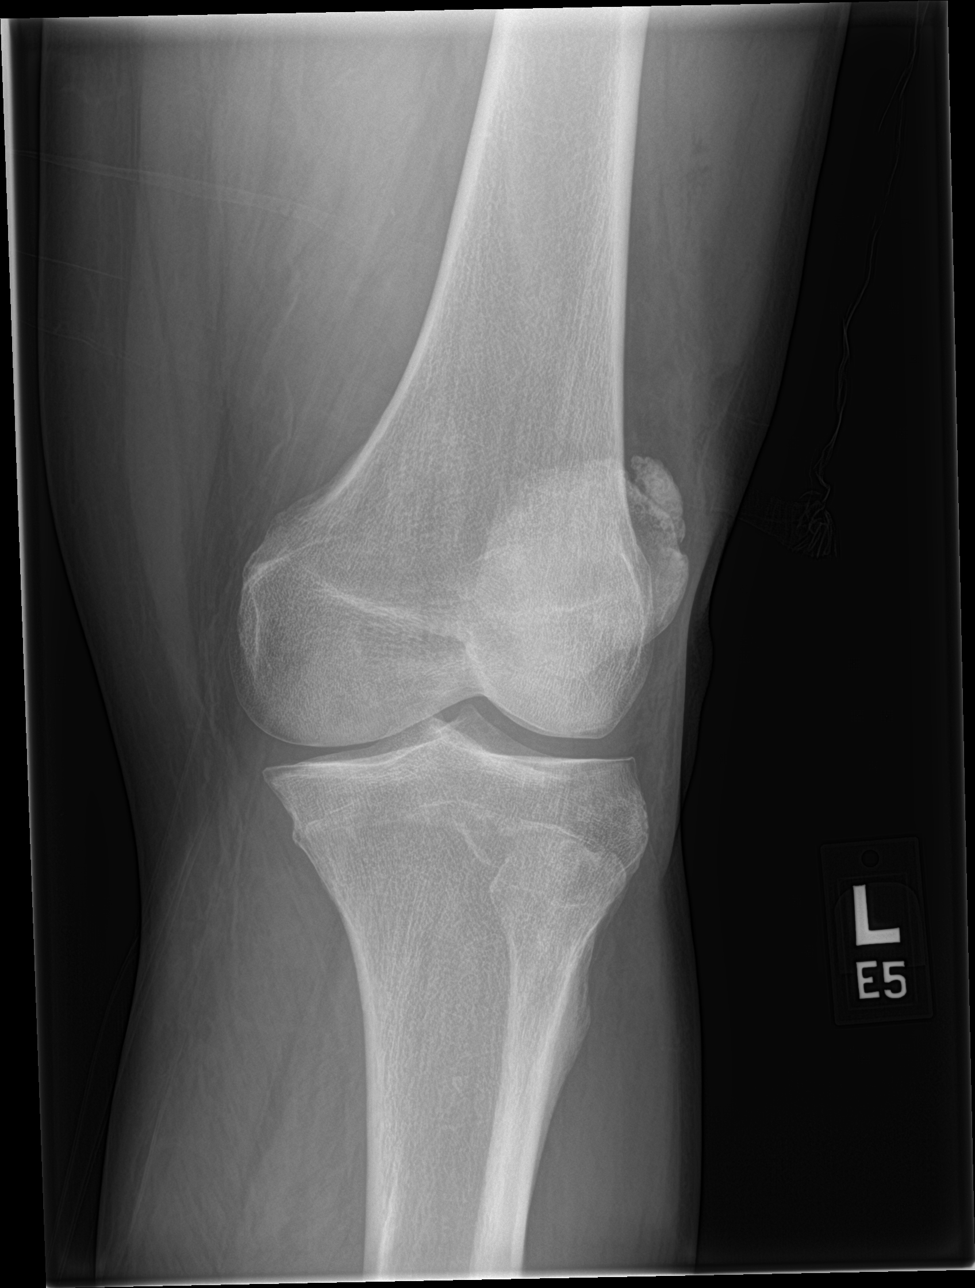

[knee lat]
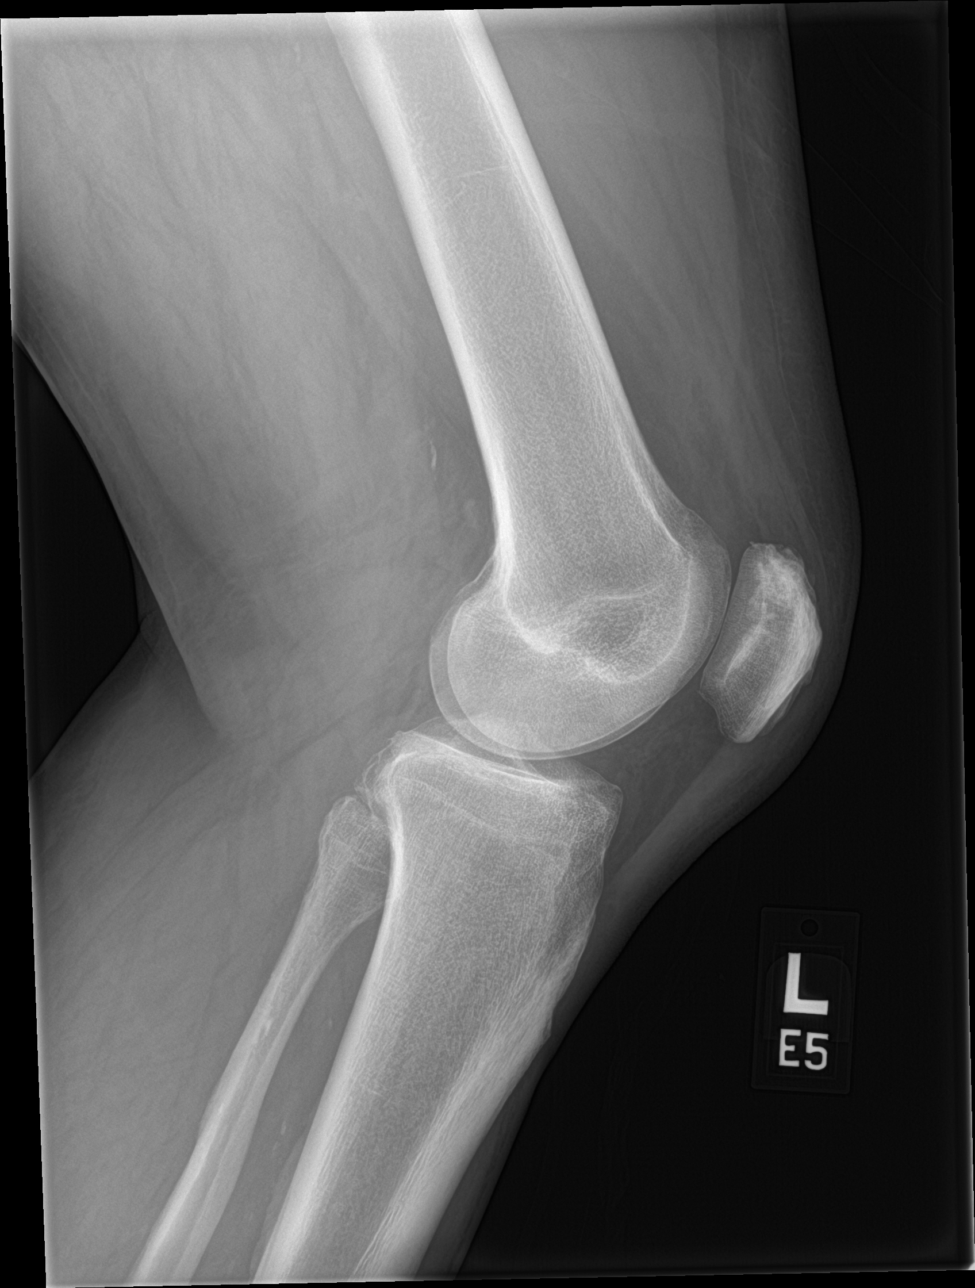

[4 of 4 positions shown; findings below may reference images not displayed]

FINDINGS: Corticated bony density noted along the superior aspect patella
consistent with bipartite patella versus old patellar fracture. No
acute bony abnormality. No evidence of acute fracture or
dislocation. Peripheral vascular calcification.
IMPRESSION: 1. Bipartite patella versus old patellar fracture. No acute bony
abnormality.

2.  Peripheral vascular disease.

## 2021-12-27 ENCOUNTER — Other Ambulatory Visit: Payer: Self-pay | Admitting: Internal Medicine

## 2022-01-02 ENCOUNTER — Encounter: Payer: Medicare Other | Admitting: Dietician

## 2022-01-02 ENCOUNTER — Ambulatory Visit (INDEPENDENT_AMBULATORY_CARE_PROVIDER_SITE_OTHER): Payer: Medicare Other | Admitting: Internal Medicine

## 2022-01-02 ENCOUNTER — Other Ambulatory Visit: Payer: Self-pay

## 2022-01-02 ENCOUNTER — Encounter: Payer: Self-pay | Admitting: Internal Medicine

## 2022-01-02 VITALS — BP 137/77 | HR 91 | Temp 97.9°F | Ht 68.0 in | Wt 209.1 lb

## 2022-01-02 DIAGNOSIS — E6609 Other obesity due to excess calories: Secondary | ICD-10-CM

## 2022-01-02 DIAGNOSIS — Z6833 Body mass index (BMI) 33.0-33.9, adult: Secondary | ICD-10-CM

## 2022-01-02 DIAGNOSIS — E119 Type 2 diabetes mellitus without complications: Secondary | ICD-10-CM

## 2022-01-02 DIAGNOSIS — I10 Essential (primary) hypertension: Secondary | ICD-10-CM

## 2022-01-02 DIAGNOSIS — E1169 Type 2 diabetes mellitus with other specified complication: Secondary | ICD-10-CM

## 2022-01-02 DIAGNOSIS — E785 Hyperlipidemia, unspecified: Secondary | ICD-10-CM | POA: Diagnosis not present

## 2022-01-02 LAB — POCT GLYCOSYLATED HEMOGLOBIN (HGB A1C): Hemoglobin A1C: 5.9 % — AB (ref 4.0–5.6)

## 2022-01-02 LAB — GLUCOSE, CAPILLARY: Glucose-Capillary: 105 mg/dL — ABNORMAL HIGH (ref 70–99)

## 2022-01-02 NOTE — Assessment & Plan Note (Addendum)
Current medications: ozempic '1mg'$  weekly, metformin '500mg'$  daily, lipitor '40mg'$  daily ?Denies adverse medication effects. Reports medication adherence, congruent with dispense report. ?A1C is 5.9 today. ?Reports that he underwent diabetic retinopathy screening in November which was unremarkable.  ?Plan:  ?? Continue ozempic, metformin, and lipitor ?? We are awaiting offical report from retinopathy screening, repeat in 1 year ?? Diabetic foot exam is up to date, repeat in January 2024 ?? Counseled on recommendations for physical activity and diet ?? Follow up in 3-6 months with A1C, lipid panel, BMP ?

## 2022-01-02 NOTE — Assessment & Plan Note (Signed)
Current medications: olmesartan-amlodipine-hctz 40-10-12.5 ?Blood pressure is at goal--137/77. ?Plan ?? Continue current management ?? F/u 3 months ?

## 2022-01-02 NOTE — Patient Instructions (Signed)
[name], it was a pleasure seeing you today! ? ?Today we discussed: ? ?DIABETES and WEIGHT LOSS ?-Your A1C is 5.9 and weight is down 12 pounds from January, which is great!  ?-Continue taking metformin and Ozempic ? ? ? ?Follow-up: 6 months  ? ?Please make sure to arrive 15 minutes prior to your next appointment. If you arrive late, you may be asked to reschedule.  ? ?We look forward to seeing you next time. Please call our clinic at [phone] if you have any questions or concerns. The best time to call is Monday-Friday from 9am-4pm, but there is someone available 24/7. If after hours or the weekend, call the main hospital number and ask for the Internal Medicine Resident On-Call. If you need medication refills, please notify your pharmacy one week in advance and they will send [place] a request. ? ?Thank you for letting us take part in your care. Wishing you the best! ? ? ?

## 2022-01-02 NOTE — Assessment & Plan Note (Addendum)
Tolerates ozempic '1mg'$  weekly however does note some mild intermittent nausea since starting it. Generally speaking, he is happy with his 12lb weight loss since January. He has made diet modifications and is slowly working on getting back into routine physical activity. Since he is still having some GI effects from the ozempic, we will hold off on up-titrating his dose.  ?

## 2022-01-02 NOTE — Progress Notes (Signed)
? ?  Office Visit ? ? Patient ID: Samuel Weiss, male    DOB: 11-14-1950, 71 y.o.   MRN: [id number]   PCP: Mitzi Hansen, MD  ? ?Subjective:  ? ?Samuel Weiss is a 71 y.o. year old male who presents for hypertension, diabetes, and obesity follow up. ? ? ?ACTIVE MEDICATIONS  ? ?Outpatient Medications Prior to Visit  ?Medication Sig  ? atorvastatin (LIPITOR) 40 MG tablet Take 1 tablet (40 mg total) by mouth daily.  ? metFORMIN (GLUCOPHAGE) 500 MG tablet Take 1 tablet (500 mg total) by mouth daily with breakfast.  ? Olmesartan-amLODIPine-HCTZ 40-10-12.5 MG TABS Take 1 tablet by mouth daily.  ? OZEMPIC, 1 MG/DOSE, 4 MG/3ML SOPN INJECT 1 MG INTO THE SKIN ONCE A WEEK  ? ?No facility-administered medications prior to visit.  ?  ? ?Objective:  ? ?BP 137/77 (BP Location: Left Arm, Patient Position: Sitting, Cuff Size: Normal)   Pulse 91   Temp 97.9 ?F (36.6 ?C) (Oral)   Ht '5\' 8"'$  (1.727 m)   Wt 209 lb 1.6 oz (94.8 kg)   SpO2 98%   BMI 31.79 kg/m?  ?Wt Readings from Last 3 Encounters:  ?01/02/22 209 lb 1.6 oz (94.8 kg)  ?11/13/21 213 lb (96.6 kg)  ?09/27/21 221 lb 12.8 oz (100.6 kg)  ?  ?BP Readings from Last 3 Encounters:  ?01/02/22 137/77  ?09/27/21 132/70  ?06/04/21 137/72  ? ? ?General: well appearing male in no distress ?Cardiac: RRR ?Pulm: lungs clear throughout ?Abdomen: soft, non-tender ? ?Assessment & Plan:  ? ?Problem List Items Addressed This Visit   ? ?  ? Cardiovascular and Mediastinum  ? Essential hypertension (Chronic)  ?  Current medications: olmesartan-amlodipine-hctz 40-10-12.5 ?Blood pressure is at goal--137/77. ?Plan ?Continue current management ?F/u 3 months ?  ?  ?  ? Endocrine  ? Type 2 diabetes mellitus with hyperlipidemia ([place]) - Primary  ?  Current medications: ozempic '1mg'$  weekly, metformin '500mg'$  daily, lipitor '40mg'$  daily ?Denies adverse medication effects. Reports medication adherence, congruent with dispense report. ?A1C is 5.9 today. ?Reports that he underwent diabetic retinopathy  screening in November which was unremarkable.  ?Plan:  ?Continue ozempic, metformin, and lipitor ?We are awaiting offical report from retinopathy screening, repeat in 1 year ?Diabetic foot exam is up to date, repeat in January 2024 ?Counseled on recommendations for physical activity and diet ?Follow up in 3-6 months with A1C, lipid panel, BMP ?  ?  ?  ? Other  ? Class 1 obesity due to excess calories with body mass index (BMI) of 33.0 to 33.9 in adult  ?  Tolerates ozempic '1mg'$  weekly however does note some mild intermittent nausea since starting it. Generally speaking, he is happy with his 12lb weight loss since January. He has made diet modifications and is slowly working on getting back into routine physical activity. Since he is still having some GI effects from the ozempic, we will hold off on up-titrating his dose.  ? ?  ?  ? ? ?Follow up in 3-6 months ?-A1C, lipid panel, BMP ? ? ?Pt discussed with Dr. Heber [place] ? ?Mitzi Hansen, MD ?Internal Medicine Resident PGY-3 ?[facility] ?01/02/2022 3:50 PM  ?  ?

## 2022-01-04 NOTE — Progress Notes (Signed)
Internal Medicine Clinic Attending  Case discussed with Dr. Christian  At the time of the visit.  We reviewed the resident's history and exam and pertinent patient test results.  I agree with the assessment, diagnosis, and plan of care documented in the resident's note.  

## 2022-01-21 ENCOUNTER — Other Ambulatory Visit: Payer: Self-pay | Admitting: Internal Medicine

## 2022-01-23 ENCOUNTER — Other Ambulatory Visit: Payer: Self-pay | Admitting: *Deleted

## 2022-01-24 MED ORDER — OZEMPIC (1 MG/DOSE) 4 MG/3ML ~~LOC~~ SOPN: 1.0000 mg | PEN_INJECTOR | SUBCUTANEOUS | 3 refills | Status: DC

## 2022-02-07 ENCOUNTER — Encounter: Payer: Self-pay | Admitting: Dietician

## 2022-03-01 ENCOUNTER — Other Ambulatory Visit: Payer: Self-pay | Admitting: Internal Medicine

## 2022-03-07 ENCOUNTER — Other Ambulatory Visit: Payer: Self-pay | Admitting: Internal Medicine

## 2022-03-07 DIAGNOSIS — E785 Hyperlipidemia, unspecified: Secondary | ICD-10-CM

## 2022-03-11 ENCOUNTER — Encounter: Payer: Self-pay | Admitting: *Deleted

## 2022-04-15 ENCOUNTER — Encounter: Payer: Self-pay | Admitting: Student

## 2022-04-15 ENCOUNTER — Ambulatory Visit (INDEPENDENT_AMBULATORY_CARE_PROVIDER_SITE_OTHER): Payer: Medicare Other | Admitting: Student

## 2022-04-15 VITALS — BP 122/69 | HR 94 | Temp 98.0°F | Ht 68.0 in | Wt 205.0 lb

## 2022-04-15 DIAGNOSIS — E1169 Type 2 diabetes mellitus with other specified complication: Secondary | ICD-10-CM | POA: Diagnosis not present

## 2022-04-15 DIAGNOSIS — I10 Essential (primary) hypertension: Secondary | ICD-10-CM

## 2022-04-15 DIAGNOSIS — E119 Type 2 diabetes mellitus without complications: Secondary | ICD-10-CM

## 2022-04-15 DIAGNOSIS — E6609 Other obesity due to excess calories: Secondary | ICD-10-CM

## 2022-04-15 DIAGNOSIS — E785 Hyperlipidemia, unspecified: Secondary | ICD-10-CM

## 2022-04-15 DIAGNOSIS — F17210 Nicotine dependence, cigarettes, uncomplicated: Secondary | ICD-10-CM

## 2022-04-15 DIAGNOSIS — Z7985 Long-term (current) use of injectable non-insulin antidiabetic drugs: Secondary | ICD-10-CM

## 2022-04-15 DIAGNOSIS — Z13220 Encounter for screening for lipoid disorders: Secondary | ICD-10-CM | POA: Insufficient documentation

## 2022-04-15 DIAGNOSIS — F17200 Nicotine dependence, unspecified, uncomplicated: Secondary | ICD-10-CM

## 2022-04-15 DIAGNOSIS — Z7984 Long term (current) use of oral hypoglycemic drugs: Secondary | ICD-10-CM | POA: Diagnosis not present

## 2022-04-15 DIAGNOSIS — Z6833 Body mass index (BMI) 33.0-33.9, adult: Secondary | ICD-10-CM

## 2022-04-15 LAB — POCT GLYCOSYLATED HEMOGLOBIN (HGB A1C): Hemoglobin A1C: 6 % — AB (ref 4.0–5.6)

## 2022-04-15 LAB — GLUCOSE, CAPILLARY: Glucose-Capillary: 89 mg/dL (ref 70–99)

## 2022-04-15 MED ORDER — OZEMPIC (2 MG/DOSE) 8 MG/3ML ~~LOC~~ SOPN: 2.0000 mg | PEN_INJECTOR | SUBCUTANEOUS | 2 refills | Status: DC

## 2022-04-15 NOTE — Patient Instructions (Signed)
Thank you, Mr.Trevian J Kiesel for allowing [place] to provide your care today. Today, we discussed your blood pressure, diabetes, cholesterol and tobacco use.  Great job keeping you blood pressure and diabetes under control.  Continue to work on your smoking cessation.  I have ordered the following labs for you:   Lab Orders         BMP8+Anion Gap         Lipid Profile         POC Hbg A1C      I will call if any are abnormal. All of your labs can be accessed through "My Chart".   I have ordered the following medication/changed the following medications:  Increase Ozempic from 1 mg injections weekly to 2 mg injections weekly  My Chart Access: [email or web address]  Please follow-up in 3 months  Please make sure to arrive 15 minutes prior to your next appointment. If you arrive late, you may be asked to reschedule.    We look forward to seeing you next time. Please call our clinic at [phone] if you have any questions or concerns. The best time to call is Monday-Friday from 9am-4pm, but there is someone available 24/7. If after hours or the weekend, call the main hospital number and ask for the Internal Medicine Resident On-Call. If you need medication refills, please notify your pharmacy one week in advance and they will send [place] a request.   Thank you for letting us take part in your care. Wishing you the best!  Lacinda Axon, MD 04/15/2022, 3:44 PM IM Resident, PGY-2 [facility] 41:10

## 2022-04-15 NOTE — Assessment & Plan Note (Signed)
Patient has lost 16 pounds since starting Ozempic earlier this year. BMI currently at 31.17 with a weight of 205 lbs. He has been tolerating the current Ozempic dose for more than 8 weeks. -Increase Ozempic from 1 mg weekly to 2 mg weekly injections. -Continue lifestyle modifications with dietary changes and exercise.

## 2022-04-15 NOTE — Assessment & Plan Note (Signed)
BP very well controlled with SBP in the 120s. He denies any headaches, blurry vision or dizziness. Vitals:   04/15/22 1449  BP: 122/69   Plan: -Continue olmesartan-amlodipine-HCTZ 40-10-12.5 mg daily -Follow-up repeat BMP -3 months follow-up

## 2022-04-15 NOTE — Assessment & Plan Note (Signed)
Patient continues to smoke 1 to 3 cigarettes daily. Discussed importance of smoking cessation and decreasing his risk factors for stroke, cancers, COPD and heart attacks. States he has a friend who was able to quit with the patch but he is currently not at the point where he is ready to quit.  Educated patient on the resources available to him when he is ready to quit. -Continue to offer smoking cessation counseling at each office visit

## 2022-04-15 NOTE — Assessment & Plan Note (Signed)
Diabetes very well controlled. Repeat A1c still stable at 6.0% from 5.9% 3 months ago. He has been making lifestyle changes to help with weight loss and his diabetes. He has lost 45 pounds since his last office visit 3 months ago. He has been tolerating Ozempic 1 mg dose very well.  Plan: -Continue metformin 500 mg daily -Increase Ozempic to 2 mg subcu weekly -Repeat A1c in 3 months
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
## 2022-04-16 LAB — BMP8+ANION GAP
Anion Gap: 18 mmol/L (ref 10.0–18.0)
BUN/Creatinine Ratio: 12 (ref 10–24)
BUN: 14 mg/dL (ref 8–27)
CO2: 20 mmol/L (ref 20–29)
Calcium: 9.8 mg/dL (ref 8.6–10.2)
Chloride: 103 mmol/L (ref 96–106)
Creatinine, Ser: 1.14 mg/dL (ref 0.76–1.27)
Glucose: 89 mg/dL (ref 70–99)
Potassium: 4.3 mmol/L (ref 3.5–5.2)
Sodium: 141 mmol/L (ref 134–144)
eGFR: 68 mL/min/{1.73_m2} (ref >59)

## 2022-04-16 LAB — LIPID PANEL
Chol/HDL Ratio: 3.2 ratio (ref 0.0–5.0)
Cholesterol, Total: 129 mg/dL (ref 100–199)
HDL: 40 mg/dL
LDL Chol Calc (NIH): 66 mg/dL (ref 0–99)
Triglycerides: 126 mg/dL (ref 0–149)
VLDL Cholesterol Cal: 23 mg/dL (ref 5–40)

## 2022-04-16 NOTE — Progress Notes (Signed)
Internal Medicine Clinic Attending  Case discussed with Dr. [place]  At the time of the visit.  We reviewed the resident's history and exam and pertinent patient test results.  I agree with the assessment, diagnosis, and plan of care documented in the resident's note.  

## 2022-04-16 NOTE — Addendum Note (Signed)
Addended by: Jodean Lima on: 04/16/2022 08:40 AM   Modules accepted: Level of Service

## 2022-04-29 ENCOUNTER — Other Ambulatory Visit: Payer: Self-pay | Admitting: *Deleted

## 2022-04-29 ENCOUNTER — Other Ambulatory Visit: Payer: Self-pay

## 2022-04-29 DIAGNOSIS — E119 Type 2 diabetes mellitus without complications: Secondary | ICD-10-CM

## 2022-04-29 DIAGNOSIS — E6609 Other obesity due to excess calories: Secondary | ICD-10-CM

## 2022-04-29 MED ORDER — OZEMPIC (2 MG/DOSE) 8 MG/3ML ~~LOC~~ SOPN: 2.0000 mg | PEN_INJECTOR | SUBCUTANEOUS | 2 refills | Status: DC

## 2022-04-29 NOTE — Telephone Encounter (Signed)
RTC to patient requesting Ozempic prescription be sent to the [facility] on [address].

## 2022-04-30 ENCOUNTER — Other Ambulatory Visit: Payer: Self-pay | Admitting: Internal Medicine

## 2022-04-30 ENCOUNTER — Other Ambulatory Visit: Payer: Self-pay | Admitting: *Deleted

## 2022-04-30 DIAGNOSIS — E119 Type 2 diabetes mellitus without complications: Secondary | ICD-10-CM

## 2022-04-30 DIAGNOSIS — E6609 Other obesity due to excess calories: Secondary | ICD-10-CM

## 2022-04-30 MED ORDER — OZEMPIC (2 MG/DOSE) 8 MG/3ML ~~LOC~~ SOPN: 2.0000 mg | PEN_INJECTOR | SUBCUTANEOUS | 3 refills | Status: DC

## 2022-04-30 MED ORDER — OZEMPIC (2 MG/DOSE) 8 MG/3ML ~~LOC~~ SOPN: 2.0000 mg | PEN_INJECTOR | SUBCUTANEOUS | 3 refills | Status: AC

## 2022-04-30 NOTE — Telephone Encounter (Signed)
Patient would like to get a 90 day supply.  It will cost the same as a 30 day supply.  Waiting at Pharmacy.

## 2022-05-27 ENCOUNTER — Other Ambulatory Visit: Payer: Self-pay | Admitting: Internal Medicine

## 2022-08-05 ENCOUNTER — Ambulatory Visit (INDEPENDENT_AMBULATORY_CARE_PROVIDER_SITE_OTHER): Payer: Medicare Other | Admitting: Student

## 2022-08-05 ENCOUNTER — Telehealth: Payer: Self-pay

## 2022-08-05 VITALS — BP 155/83 | HR 86 | Temp 98.1°F | Wt 214.3 lb

## 2022-08-05 DIAGNOSIS — E1169 Type 2 diabetes mellitus with other specified complication: Secondary | ICD-10-CM

## 2022-08-05 DIAGNOSIS — E785 Hyperlipidemia, unspecified: Secondary | ICD-10-CM | POA: Diagnosis not present

## 2022-08-05 DIAGNOSIS — R052 Subacute cough: Secondary | ICD-10-CM | POA: Diagnosis not present

## 2022-08-05 MED ORDER — BENZONATATE 100 MG PO CAPS: 100.0000 mg | ORAL_CAPSULE | Freq: Three times a day (TID) | ORAL | 0 refills | Status: AC | PRN

## 2022-08-05 NOTE — Patient Instructions (Signed)
Cough You likely have a cold but your oxygen levels are good and lungs sound clear Try Tessalon Perles 1 tablet 3 times a day as needed for cough You can continue mucinex and teas and tylenol to help with symptoms   If your symptoms are not improving or you develop shortness of breath and fever please return to clinic for follow up   Ozmepic Please restart this medication Give the clinic a call if you have significant side effect of diarrhea stomach pains with start back on your old dose and we can send in for a lower dose  Follow up in clinic in about 2-4 weeks for diabetes and hypertension follow up

## 2022-08-05 NOTE — Telephone Encounter (Signed)
Pt states he has a cold for the past 3 weeks, want to know if he can be seen. No open slot for an appt. Please call pt back.

## 2022-08-06 ENCOUNTER — Encounter: Payer: Self-pay | Admitting: Student

## 2022-08-06 DIAGNOSIS — R059 Cough, unspecified: Secondary | ICD-10-CM | POA: Insufficient documentation

## 2022-08-06 NOTE — Progress Notes (Signed)
Established Patient Office Visit  Subjective   Patient ID: Samuel Weiss, male    DOB: 25-Feb-1951  Age: 71 y.o. MRN: [id number]  Chief Complaint  Patient presents with   Cough    Samuel Weiss is a 71 y.o. person living with a history listed below who present to clinic for cough for the past 3 weeks. Please refer to problem based charting for further details and assessment and plan of current problem and chronic medical conditions.     Patient Active Problem List   Diagnosis Date Noted   Cough 08/06/2022   hyperlipidemia 04/15/2022   Class 1 obesity due to excess calories with body mass index (BMI) of 33.0 to 33.9 in adult 06/05/2021   Chronic pain of left knee 04/23/2021   Alcohol use 04/23/2021   Type 2 diabetes mellitus with hyperlipidemia ([place]) 07/06/2019   Tobacco use disorder 11/17/2015   Essential hypertension 10/20/2015   Healthcare maintenance 10/20/2015        Review of Systems  Constitutional:  Negative for chills and fever.  Respiratory:  Positive for cough and sputum production. Negative for shortness of breath and wheezing.   Cardiovascular:  Negative for chest pain.  All other systems reviewed and are negative.     Objective:     BP (!) 155/83 (BP Location: Left Arm, Patient Position: Sitting, Cuff Size: Normal)   Pulse 86   Temp 98.1 F (36.7 C) (Oral)   Wt 214 lb 4.8 oz (97.2 kg)   SpO2 98%   BMI 32.58 kg/m     Physical Exam Constitutional:      Appearance: Normal appearance.  HENT:     Mouth/Throat:     Mouth: Mucous membranes are moist.     Pharynx: Oropharynx is clear.  Cardiovascular:     Rate and Rhythm: Normal rate and regular rhythm.  Pulmonary:     Effort: Pulmonary effort is normal.     Breath sounds: Normal breath sounds. No wheezing, rhonchi or rales.     Comments: Intermittently coughing during exam Abdominal:     General: Abdomen is flat. Bowel sounds are normal. There is no distension.     Palpations: Abdomen is  soft.     Tenderness: There is no abdominal tenderness.  Musculoskeletal:        General: Normal range of motion.     Right lower leg: No edema.     Left lower leg: No edema.  Skin:    General: Skin is warm and dry.     Capillary Refill: Capillary refill takes less than 2 seconds.  Neurological:     General: No focal deficit present.     Mental Status: He is alert and oriented to person, place, and time.  Psychiatric:        Mood and Affect: Mood normal.        Behavior: Behavior normal.      No results found for any visits on 08/05/22.     The ASCVD Risk score (Arnett DK, et al., 2019) failed to calculate for the following reasons:   The valid total cholesterol range is 130 to 320 mg/dL    Assessment & Plan:   Problem List Items Addressed This Visit       Endocrine   Type 2 diabetes mellitus with hyperlipidemia ([place])    Stopped ozempic 3 weeks ago due to intermittent blurred vision. Does not seem this helps Has gained 10 lbs since stopping this. Do not think  that ozempic would cause intermittent blurred vision. Will have I'm follow up with his optometrist regarding this. He will restart '2mg'$  ozempic weekly. Advised he may have some GI side effect as he missed 3 doses. He would rather try the higher dose than be re titrated. He will call clinic if he is having issues and we can have him switch to lower dose. Follow up in 2-4 weeks for diabetes when feeling better.        Other   Cough - Primary    Presents for 3 weeks of cough. Felt better for a few day but than started coughing again 2 days ago. Cough more at night with sputum production. Slight sore throat. Using mucinex and ginger lemon tea. Feeling a little bit better today. No fever, chills, SOB, congestion. Thinks he may have given his lady friend and nephew the cold but they are now recovered. Did have flue shot a few months ago.  He is afebrile with good O2 saturation on exam today. Lungs are clear to auscultation.  Low suspicion for LRI/secondary bacterial pneumonia. Likely may have a post viral cough.  -tessalon Perles  -RTC precautions for fever, dyspnea, worsening cough          Return in about 4 weeks (around 09/02/2022).    Samuel Beard, MD

## 2022-08-06 NOTE — Assessment & Plan Note (Signed)
Presents for 3 weeks of cough. Felt better for a few day but than started coughing again 2 days ago. Cough more at night with sputum production. Slight sore throat. Using mucinex and ginger lemon tea. Feeling a little bit better today. No fever, chills, SOB, congestion. Thinks he may have given his lady friend and nephew the cold but they are now recovered. Did have flue shot a few months ago.  He is afebrile with good O2 saturation on exam today. Lungs are clear to auscultation. Low suspicion for LRI/secondary bacterial pneumonia. Likely may have a post viral cough.  -tessalon Perles  -RTC precautions for fever, dyspnea, worsening cough

## 2022-08-06 NOTE — Assessment & Plan Note (Signed)
Stopped ozempic 3 weeks ago due to intermittent blurred vision. Does not seem this helps Has gained 10 lbs since stopping this. Do not think that ozempic would cause intermittent blurred vision. Will have I'm follow up with his optometrist regarding this. He will restart '2mg'$  ozempic weekly. Advised he may have some GI side effect as he missed 3 doses. He would rather try the higher dose than be re titrated. He will call clinic if he is having issues and we can have him switch to lower dose. Follow up in 2-4 weeks for diabetes when feeling better.

## 2022-08-11 ENCOUNTER — Encounter (HOSPITAL_COMMUNITY): Payer: Self-pay | Admitting: Emergency Medicine

## 2022-08-11 ENCOUNTER — Ambulatory Visit (HOSPITAL_COMMUNITY)
Admission: EM | Admit: 2022-08-11 | Discharge: 2022-08-11 | Disposition: A | Discharge status: Home / Self Care | Payer: Medicare Other | Attending: Emergency Medicine | Admitting: Emergency Medicine

## 2022-08-11 DIAGNOSIS — R059 Cough, unspecified: Secondary | ICD-10-CM | POA: Diagnosis not present

## 2022-08-11 DIAGNOSIS — Z20822 Contact with and (suspected) exposure to covid-19: Secondary | ICD-10-CM | POA: Insufficient documentation

## 2022-08-11 NOTE — ED Triage Notes (Signed)
Pt had cough 2.5 weeks ago and was better but it's back now. Reports that friend has covid.  Pt took prescription that was given this week that helped cough a little bit. Reports "getting cold up".  Pt wanting covid test.

## 2022-08-11 NOTE — Discharge Instructions (Signed)
Follow up with [facility] if you still have trouble with your cough in a month. Continue to use Tussin if it is helpful - drink lots of liquids.   You will get a call if test is positive, you will not get a call if test is negative but you can check results in MyChart if you have a MyChart account.

## 2022-08-11 NOTE — ED Provider Notes (Signed)
[facility]    CSN: [id number] Arrival date & time: 08/11/22  1132      History   Chief Complaint Chief Complaint  Patient presents with   Cough    HPI Samuel Weiss is a 71 y.o. male. Pt had a cold and then had a lingering cough. Saw PCP for this 11/20. Felt sx improve and then began coughing  again. Girlfriend is also sick now and has covid. Pt is worried he might have covid. Is managing sx with tessalon perles and Tussin for good relieft/management of sx. Does have some post nasal drainage, most noticeable in the morning.   Denis any other sx including nasal congestion, fever, chills, sore throat.    Cough   Past Medical History:  Diagnosis Date   Hyperlipidemia    Hypertension    Multiple facial fractures, open, initial encounter ([place]) 05/30/2018   Tobacco use disorder    Type 2 diabetes mellitus ([place])     Patient Active Problem List   Diagnosis Date Noted   Cough 08/06/2022   hyperlipidemia 04/15/2022   Class 1 obesity due to excess calories with body mass index (BMI) of 33.0 to 33.9 in adult 06/05/2021   Chronic pain of left knee 04/23/2021   Alcohol use 04/23/2021   Type 2 diabetes mellitus with hyperlipidemia ([place]) 07/06/2019   Tobacco use disorder 11/17/2015   Essential hypertension 10/20/2015   Healthcare maintenance 10/20/2015    Past Surgical History:  Procedure Laterality Date   CLOSED REDUCTION MANDIBLE N/A 05/30/2018   Procedure: CLOSED REDUCTION MANDIBULAR FRACTURE;  Surgeon: Michael Litter, DMD;  Location: [facility];  Service: Oral Surgery;  Laterality: N/A;   ORIF FACIAL FRACTURE N/A 05/30/2018   Procedure: OPEN REDUCTION INTERNAL FIXATION (ORIF) MULTIPLE FACIAL FRACTURES;  Surgeon: Michael Litter, DMD;  Location: [facility];  Service: Oral Surgery;  Laterality: N/A;       Home Medications    Prior to Admission medications   Medication Sig Start Date End Date Taking? Authorizing Provider  atorvastatin (LIPITOR) 40 MG tablet TAKE 1 TABLET  BY MOUTH EVERY DAY 03/07/22   Mitzi Hansen, MD  benzonatate (TESSALON PERLES) 100 MG capsule Take 1 capsule (100 mg total) by mouth 3 (three) times daily as needed for up to 15 days for cough. 08/05/22 08/20/22  Iona Beard, MD  metFORMIN (GLUCOPHAGE) 500 MG tablet Take 1 tablet (500 mg total) by mouth daily with breakfast. 09/27/21   Mitzi Hansen, MD  Olmesartan-amLODIPine-HCTZ 40-10-12.5 MG TABS Take 1 tablet by mouth once daily 05/29/22   Lottie Mussel, MD    Family History Family History  Problem Relation Age of Onset   Hypertension Mother    Diabetes Mother    Hypertension Father    Kidney disease Sister    Diabetes Sister    Obesity Sister    Diabetes Sister    Heart disease Sister    Kidney disease Sister    Diabetes Sister    Kidney disease Brother    Kidney disease Brother    Cancer Maternal Aunt     Social History Social History   Tobacco Use   Smoking status: Some Days    Packs/day: 0.15    Types: Cigarettes   Smokeless tobacco: Current  Vaping Use   Vaping Use: Never used  Substance Use Topics   Alcohol use: Yes    Alcohol/week: 3.0 standard drinks of alcohol    Types: 3 Cans of beer per week   Drug use: Not Currently  Types: Marijuana     Allergies   Patient has no known allergies.   Review of Systems Review of Systems   Physical Exam Triage Vital Signs ED Triage Vitals [08/11/22 1254]  Enc Vitals Group     BP 127/77     Pulse Rate 88     Resp 18     Temp (!) 97.5 F (36.4 C)     Temp Source Oral     SpO2 95 %     Weight      Height      Head Circumference      Peak Flow      Pain Score 0     Pain Loc      Pain Edu?      Excl. in [place]?    No data found.  Updated Vital Signs BP 127/77 (BP Location: Right Arm)   Pulse 88   Temp (!) 97.5 F (36.4 C) (Oral)   Resp 18   SpO2 95%   Visual Acuity Right Eye Distance:   Left Eye Distance:   Bilateral Distance:    Right Eye Near:   Left Eye Near:    Bilateral Near:      Physical Exam Constitutional:      General: He is not in acute distress.    Appearance: Normal appearance. He is not ill-appearing.  HENT:     Nose: No congestion.  Cardiovascular:     Rate and Rhythm: Normal rate and regular rhythm.  Pulmonary:     Effort: Pulmonary effort is normal.     Breath sounds: Normal breath sounds. No wheezing or rhonchi.  Neurological:     Mental Status: He is alert.      UC Treatments / Results  Labs (all labs ordered are listed, but only abnormal results are displayed) Labs Reviewed  SARS CORONAVIRUS 2 (TAT 6-24 HRS)    EKG   Radiology No results found.  Procedures Procedures (including critical care time)  Medications Ordered in UC Medications - No data to display  Initial Impression / Assessment and Plan / UC Course  I have reviewed the triage vital signs and the nursing notes.  Pertinent labs & imaging results that were available during my care of the patient were reviewed by me and considered in my medical decision making (see chart for details).    Covid test results pending. Reassured pt that post-viral cough can persist for quite some time after recovery from illness.   Final Clinical Impressions(s) / UC Diagnoses   Final diagnoses:  Exposure to COVID-19 virus  Cough, unspecified type     Discharge Instructions      Follow up with [facility] if you still have trouble with your cough in a month. Continue to use Tussin if it is helpful - drink lots of liquids.   You will get a call if test is positive, you will not get a call if test is negative but you can check results in MyChart if you have a MyChart account.     ED Prescriptions   None    PDMP not reviewed this encounter.   Carvel Getting, NP 08/11/22 1450

## 2022-08-12 LAB — SARS CORONAVIRUS 2 (TAT 6-24 HRS): SARS Coronavirus 2: NEGATIVE

## 2022-08-12 NOTE — Progress Notes (Signed)
Internal Medicine Clinic Attending  Case discussed with Dr. Lisabeth Devoid  At the time of the visit.  We reviewed the resident's history and exam and pertinent patient test results.  I agree with the assessment, diagnosis, and plan of care documented in the resident's note.  We discussed potential risk of retinopathy progression with semaglutide, however Samuel Weiss last eye exam without any evidence of retinopathy and he has been titrated up to maximum dose. Due for annual diabetic eye exam 09/2022.

## 2022-08-26 ENCOUNTER — Other Ambulatory Visit: Payer: Self-pay | Admitting: *Deleted

## 2022-08-26 ENCOUNTER — Telehealth: Payer: Self-pay | Admitting: Internal Medicine

## 2022-08-26 MED ORDER — OZEMPIC (2 MG/DOSE) 8 MG/3ML ~~LOC~~ SOPN: 2.0000 mg | PEN_INJECTOR | SUBCUTANEOUS | 3 refills | Status: AC

## 2022-08-26 NOTE — Telephone Encounter (Signed)
Patient requesting refill on his Semaglutide.  Took last dose this am.

## 2022-08-26 NOTE — Telephone Encounter (Signed)
Pt states he took his last dose.  Ozempic (2 MG/DOSE) Semaglutide, 2 MG/DOSE, (OZEMPIC, 2 MG/DOSE,) 8 MG/3ML [address] [place], [place] [address] Phone: [phone]  Fax: [phone]

## 2022-09-02 ENCOUNTER — Other Ambulatory Visit: Payer: Self-pay | Admitting: Internal Medicine

## 2022-09-02 NOTE — Telephone Encounter (Signed)
Next appt scheduled 09/05/22 with Dr Jodell Cipro.

## 2022-09-05 ENCOUNTER — Ambulatory Visit (INDEPENDENT_AMBULATORY_CARE_PROVIDER_SITE_OTHER): Payer: Medicare Other

## 2022-09-05 VITALS — BP 138/82 | HR 87 | Temp 98.1°F | Ht 68.0 in | Wt 212.3 lb

## 2022-09-05 DIAGNOSIS — Z7985 Long-term (current) use of injectable non-insulin antidiabetic drugs: Secondary | ICD-10-CM | POA: Diagnosis not present

## 2022-09-05 DIAGNOSIS — R053 Chronic cough: Secondary | ICD-10-CM | POA: Diagnosis not present

## 2022-09-05 DIAGNOSIS — E1169 Type 2 diabetes mellitus with other specified complication: Secondary | ICD-10-CM | POA: Diagnosis not present

## 2022-09-05 DIAGNOSIS — E785 Hyperlipidemia, unspecified: Secondary | ICD-10-CM | POA: Diagnosis not present

## 2022-09-05 DIAGNOSIS — I10 Essential (primary) hypertension: Secondary | ICD-10-CM | POA: Diagnosis not present

## 2022-09-05 DIAGNOSIS — Z7984 Long term (current) use of oral hypoglycemic drugs: Secondary | ICD-10-CM | POA: Diagnosis not present

## 2022-09-05 DIAGNOSIS — F17210 Nicotine dependence, cigarettes, uncomplicated: Secondary | ICD-10-CM | POA: Diagnosis not present

## 2022-09-05 LAB — POCT GLYCOSYLATED HEMOGLOBIN (HGB A1C): Hemoglobin A1C: 6.4 % — AB (ref 4.0–5.6)

## 2022-09-05 LAB — GLUCOSE, CAPILLARY: Glucose-Capillary: 103 mg/dL — ABNORMAL HIGH (ref 70–99)

## 2022-09-05 MED ORDER — LORATADINE 10 MG PO TABS: 10.0000 mg | ORAL_TABLET | Freq: Every day | ORAL | 2 refills | Status: DC

## 2022-09-05 MED ORDER — GUAIFENESIN 100 MG/5ML PO LIQD: 5.0000 mL | ORAL | 0 refills | Status: DC | PRN

## 2022-09-05 MED ORDER — FLUTICASONE PROPIONATE 50 MCG/ACT NA SUSP: 1.0000 | Freq: Every day | NASAL | 2 refills | Status: DC

## 2022-09-05 NOTE — Patient Instructions (Addendum)
[name], it was a pleasure seeing you today! You endorsed feeling well today. Below are some of the things we talked about this visit. We look forward to seeing you in the follow up appointment!  Today we discussed: Cough: try flonase and claritin for the next month and see if that helps your cough. We will follow up in 4 weeks   Diabetes: continue your current medicines  Foot calluses: let us know if your calluses get worse or you notice open wounds, we can set you up with a podiatrist  I have ordered the following labs today:  Lab Orders         Microalbumin / Creatinine Urine Ratio         Glucose, capillary         POC Hbg A1C       Referrals ordered today:   Referral Orders  No referral(s) requested today     I have ordered the following medication/changed the following medications:   Stop the following medications: There are no discontinued medications.   Start the following medications: Meds ordered this encounter  Medications   fluticasone (FLONASE) 50 MCG/ACT nasal spray    Sig: Place 1 spray into both nostrils daily.    Refill:  2   loratadine (CLARITIN) 10 MG tablet    Sig: Take 1 tablet (10 mg total) by mouth daily.    Dispense:  30 tablet    Refill:  2   guaiFENesin (ROBITUSSIN) 100 MG/5ML liquid    Sig: Take 5 mLs by mouth every 4 (four) hours as needed for cough or to loosen phlegm.    Dispense:  120 mL    Refill:  0     Follow-up:  4 weeks    Please make sure to arrive 15 minutes prior to your next appointment. If you arrive late, you may be asked to reschedule.   We look forward to seeing you next time. Please call our clinic at [phone] if you have any questions or concerns. The best time to call is Monday-Friday from 9am-4pm, but there is someone available 24/7. If after hours or the weekend, call the main hospital number and ask for the Internal Medicine Resident On-Call. If you need medication refills, please notify your pharmacy  one week in advance and they will send [place] a request.  Thank you for letting us take part in your care. Wishing you the best!  Thank you, Linus Galas MD

## 2022-09-05 NOTE — Assessment & Plan Note (Signed)
Blood pressure well-controlled in clinic on olmesartan-amlodipine-HCTZ 40-10-12.5 mg daily, no symptoms.  Will continue current therapy.

## 2022-09-05 NOTE — Assessment & Plan Note (Addendum)
Ozempic restarted at 2 mg at last visit and he is also taking metformin 500 mg daily. He doesn't have vision issues. He says he has lost a little bit of weight and it also helps with nighttime snacking and controlling appetite.  No nausea or vomiting.  A1c today is 6.4.  Urine microalbumin/creatinine ratio collected today.  Will continue current regimen.

## 2022-09-05 NOTE — Assessment & Plan Note (Addendum)
Still having cough productive of clear sputum. He said it got better last week but is coming back now, but has had cough for about 2 months now. No viral prodrome reported. No fevers or chills. He has associated postnasal drip. Says he does usually get symptoms like this around this time of year. He is smoking about a pack/week and has smoked for 50 years.  Exam is unremarkable without any wheezing, crackles.  He is oxygenating well on room air.  Think his symptoms right now can be attributed to postnasal drip, but given his smoking history also is at risk for COPD/asthma.  He does not have any hemoptysis or other concerning red flags for malignancy.  If his cough does not improve with Flonase/antihistamine at the time of his next visit in 4 week, would start pulmonary workup with PFTs and chest x-ray or CT.

## 2022-09-05 NOTE — Progress Notes (Signed)
.    CC: f\u ozempic, cough  HPI:  Samuel Weiss is a 71 y.o.-year-old male with past medical history as below presenting for follow-up after starting Ozempic and for now chronic cough.  Please see encounters tab for problem-based charting.  Past Medical History:  Diagnosis Date   Hyperlipidemia    Hypertension    Multiple facial fractures, open, initial encounter ([facility]) 05/30/2018   Tobacco use disorder    Type 2 diabetes mellitus ([place])    Review of Systems: As in HPI.  Please see encounters tab for problem based charting.  Physical Exam:  Vitals:   09/05/22 0949  BP: 138/82  Pulse: 87  Temp: 98.1 F (36.7 C)  TempSrc: Oral  SpO2: 98%  Weight: 212 lb 4.8 oz (96.3 kg)  Height: '5\' 8"'$  (1.727 m)   General:Well-appearing, pleasant, In NAD Cardiac: RRR, no murmurs rubs or gallops. Respiratory: Normal work of breathing on room air, CTAB Abdominal: Soft, nontender, nondistended   Assessment & Plan:   Type 2 diabetes mellitus with hyperlipidemia ([place]) Ozempic restarted at 2 mg at last visit and he is also taking metformin 500 mg daily. He doesn't have vision issues. He says he has lost a little bit of weight and it also helps with nighttime snacking and controlling appetite.  No nausea or vomiting.  A1c today is 6.4.  Urine microalbumin/creatinine ratio collected today.  Will continue current regimen.  Cough Still having cough productive of clear sputum. He said it got better last week but is coming back now, but has had cough for about 2 months now. No viral prodrome reported. No fevers or chills. He has associated postnasal drip. Says he does usually get symptoms like this around this time of year. He is smoking about a pack/week and has smoked for 50 years.  Exam is unremarkable without any wheezing, crackles.  He is oxygenating well on room air.  Think his symptoms right now can be attributed to postnasal drip, but given his smoking history also is at risk for COPD/asthma.   He does not have any hemoptysis or other concerning red flags for malignancy.  If his cough does not improve with Flonase/antihistamine at the time of his next visit in 4 week, would start pulmonary workup with PFTs and chest x-ray or CT.  Essential hypertension Blood pressure well-controlled in clinic on olmesartan-amlodipine-HCTZ 40-10-12.5 mg daily, no symptoms.  Will continue current therapy.   Patient seen with Dr.  Cain Sieve

## 2022-09-06 LAB — MICROALBUMIN / CREATININE URINE RATIO
Creatinine, Urine: 179.3 mg/dL
Microalb/Creat Ratio: 7 mg/g creat (ref 0–29)
Microalbumin, Urine: 12.4 ug/mL

## 2022-09-11 NOTE — Progress Notes (Signed)
Internal Medicine Clinic Attending  I saw and evaluated the patient.  I personally confirmed the key portions of the history and exam documented by Dr. Jodell Cipro and I reviewed pertinent patient test results.  The assessment, diagnosis, and plan were formulated together and I agree with the documentation in the resident's note.   I'm scheduled to see patient in February - reassess cough & consider PFTs then

## 2022-10-29 NOTE — Progress Notes (Unsigned)
[facility]: Clinic Note  Subjective:  History of Present Illness: Samuel Weiss is a 72 y.o. year old male who presents for follow up of his chronic medical conditions. I am his new PCP.  Back in October, he was having a productive cough. This has fully resolved now. He is no longer taking the tessalon perles, flonase, or allergy medicine.    Please refer to Assessment and Plan below for full details in Problem-Based Charting.   Past Medical History:  Patient Active Problem List   Diagnosis Date Noted   hyperlipidemia 04/15/2022   Class 1 obesity due to excess calories with body mass index (BMI) of 33.0 to 33.9 in adult 06/05/2021   Chronic pain of left knee 04/23/2021   Alcohol use 04/23/2021   Type 2 diabetes mellitus with hyperlipidemia ([place]) 07/06/2019   Tobacco use disorder 11/17/2015   Essential hypertension 10/20/2015   Healthcare maintenance 10/20/2015    Medications:  Current Outpatient Medications:    atorvastatin (LIPITOR) 40 MG tablet, TAKE 1 TABLET BY MOUTH EVERY DAY, Disp: 90 tablet, Rfl: 4   metFORMIN (GLUCOPHAGE) 500 MG tablet, Take 1 tablet by mouth once daily with breakfast, Disp: 90 tablet, Rfl: 0   Olmesartan-amLODIPine-HCTZ 40-10-12.5 MG TABS, Take 1 tablet by mouth once daily, Disp: 90 tablet, Rfl: 3   Semaglutide, 2 MG/DOSE, (OZEMPIC, 2 MG/DOSE,) 8 MG/3ML SOPN, Inject 2 mg into the skin once a week., Disp: 3 mL, Rfl: 3   Allergies: No Known Allergies    Objective:   Vitals: Vitals:   10/30/22 1051 10/30/22 1116  BP: (!) 144/86 137/89  Pulse: 91 82  Temp: 98.2 F (36.8 C)   SpO2: 99%       Physical Exam: Physical Exam Constitutional:      Appearance: Normal appearance.  Cardiovascular:     Pulses: Normal pulses.     Heart sounds: Normal heart sounds.  Pulmonary:     Effort: Pulmonary effort is normal.     Breath sounds: Normal breath sounds.  Skin:    Comments: On diabetic foot exam, he has 2+ DP pulses  bilaterally. Feet are warm. He has bilateral <1cm calluses on the lateral plantar surfaces of both feed. Nonpainful and no evidence of ulceration.  Neurological:     Mental Status: He is alert.      Data: Labs, imaging, and micro were reviewed in Epic. Refer to Assessment and Plan below for full details in Problem-Based Charting.  Assessment & Plan:  Essential hypertension - Chronic and stable - Continue Olmesartan-amlodipine-hctz 40-10-12.5 one tab daily  Healthcare maintenance - he got his flu shot at [facility], we will look for these records - We discuss getting Shingrix at outside pharmacy  Tobacco use disorder - He continues to smoke a few cigarettes daily, 1 pack lasts 3-4 days. He has been smoking this way for 50 years. - smoking cessation counseling offered  Type 2 diabetes mellitus with hyperlipidemia ([place]) For his diabetes, his A1C was 6.3 in 08/2022. When Ozempic was increased from 5m to 2684m he noticed that when he ate a large amount, the food would not be fully digested, which bothered him. He stopped Ozempic about 2 weeks ago, and is wondering if it is safe to take. I counseled him that it is safe to take, but if he's experiencing GI side effects from the 84m45mI recommend decreasing back to 1mg53mekly. He would like to think about this.  - repeat A1C at next visit -  continue Ozempic 70m weekly for now. If he is unable to tolerate this, plan to decrease to 167mweekly  - urine MACR negative in 08/2022 - patient will call his eye doctor for annual exam  - foot exam done today       Patient will follow up in 2 months. Repeat A1C at that visit.  JuLottie MusselMD

## 2022-10-30 ENCOUNTER — Encounter: Payer: Self-pay | Admitting: Internal Medicine

## 2022-10-30 ENCOUNTER — Ambulatory Visit (INDEPENDENT_AMBULATORY_CARE_PROVIDER_SITE_OTHER): Payer: Medicare Other | Admitting: Internal Medicine

## 2022-10-30 ENCOUNTER — Ambulatory Visit: Payer: Medicare Other

## 2022-10-30 VITALS — BP 137/89 | HR 82 | Temp 98.2°F | Ht 68.0 in | Wt 215.1 lb

## 2022-10-30 DIAGNOSIS — Z7985 Long-term (current) use of injectable non-insulin antidiabetic drugs: Secondary | ICD-10-CM

## 2022-10-30 DIAGNOSIS — Z0000 Encounter for general adult medical examination without abnormal findings: Secondary | ICD-10-CM

## 2022-10-30 DIAGNOSIS — E785 Hyperlipidemia, unspecified: Secondary | ICD-10-CM | POA: Diagnosis not present

## 2022-10-30 DIAGNOSIS — I10 Essential (primary) hypertension: Secondary | ICD-10-CM | POA: Diagnosis not present

## 2022-10-30 DIAGNOSIS — F17210 Nicotine dependence, cigarettes, uncomplicated: Secondary | ICD-10-CM | POA: Diagnosis not present

## 2022-10-30 DIAGNOSIS — Z7984 Long term (current) use of oral hypoglycemic drugs: Secondary | ICD-10-CM | POA: Diagnosis not present

## 2022-10-30 DIAGNOSIS — F17200 Nicotine dependence, unspecified, uncomplicated: Secondary | ICD-10-CM

## 2022-10-30 DIAGNOSIS — E1169 Type 2 diabetes mellitus with other specified complication: Secondary | ICD-10-CM | POA: Diagnosis not present

## 2022-10-30 NOTE — Assessment & Plan Note (Signed)
-   Chronic and stable - Continue Olmesartan-amlodipine-hctz 40-10-12.5 one tab daily

## 2022-10-30 NOTE — Assessment & Plan Note (Signed)
-   He continues to smoke a few cigarettes daily, 1 pack lasts 3-4 days. He has been smoking this way for 50 years. - smoking cessation counseling offered

## 2022-10-30 NOTE — Patient Instructions (Signed)
Dear Samuel Weiss,  It was a pleasure seeing you in clinic today.  I am glad your cough is better. We encourage you to quit smoking, and if you'd like to talk to a counselor about this, please let us know. We have one in our clinic.  For your diabetes, you're not quite due for an A1C today, but we will check this next time. If the Ozempic 29m is too much for you and is causing side effects, I recommend decreasing the dose to 115mweekly. Please let usKoreanow if you'd like me to make this change in the future.    Sincerely, Dr. JuLottie Mussel

## 2022-10-30 NOTE — Assessment & Plan Note (Addendum)
For his diabetes, his A1C was 6.3 in 08/2022. When Ozempic was increased from 46m to 217m he noticed that when he ate a large amount, the food would not be fully digested, which bothered him. He stopped Ozempic about 2 weeks ago, and is wondering if it is safe to take. I counseled him that it is safe to take, but if he's experiencing GI side effects from the 64m65mI recommend decreasing back to 1mg66mekly. He would like to think about this.  - repeat A1C at next visit - continue Ozempic 64mg 77mkly for now. If he is unable to tolerate this, plan to decrease to 1mg w88mly  - urine MACR negative in 08/2022 - patient will call his eye doctor for annual exam  - foot exam done today

## 2022-10-30 NOTE — Assessment & Plan Note (Signed)
-   he got his flu shot at [facility], we will look for these records - We discuss getting Shingrix at outside pharmacy

## 2022-11-25 ENCOUNTER — Encounter: Payer: Self-pay | Admitting: Podiatry

## 2022-11-25 ENCOUNTER — Ambulatory Visit (INDEPENDENT_AMBULATORY_CARE_PROVIDER_SITE_OTHER): Payer: Medicare Other | Admitting: Podiatry

## 2022-11-25 DIAGNOSIS — E785 Hyperlipidemia, unspecified: Secondary | ICD-10-CM

## 2022-11-25 DIAGNOSIS — B351 Tinea unguium: Secondary | ICD-10-CM | POA: Diagnosis not present

## 2022-11-25 DIAGNOSIS — E1169 Type 2 diabetes mellitus with other specified complication: Secondary | ICD-10-CM

## 2022-11-25 DIAGNOSIS — M79674 Pain in right toe(s): Secondary | ICD-10-CM

## 2022-11-25 DIAGNOSIS — M79675 Pain in left toe(s): Secondary | ICD-10-CM | POA: Diagnosis not present

## 2022-11-25 DIAGNOSIS — L84 Corns and callosities: Secondary | ICD-10-CM | POA: Diagnosis not present

## 2022-11-25 NOTE — Progress Notes (Signed)
  Subjective:  Patient ID: Samuel Weiss, male    DOB: 1951/04/25,   MRN: [id number]  Chief Complaint  Patient presents with   Nail Problem    Nail trim     72 y.o. male presents for concern of thickened elongated and painful nails that are difficult to trim. Also complains of callus. Requesting to have them trimmed today. Relates burning and tingling in their feet. Patient is diabetic and last A1c was  Lab Results  Component Value Date   HGBA1C 6.4 (A) 09/05/2022   .   PCP:  Lottie Mussel, MD    . Denies any other pedal complaints. Denies n/v/f/c.   Past Medical History:  Diagnosis Date   Hyperlipidemia    Hypertension    Multiple facial fractures, open, initial encounter ([place]) 05/30/2018   Tobacco use disorder    Type 2 diabetes mellitus (HCC)     Objective:  Physical Exam: Vascular: DP/PT pulses 2/4 bilateral. CFT <3 seconds. Absent hair growth on digits. Edema noted to bilateral lower extremities. Xerosis noted bilaterally.  Skin. No lacerations or abrasions bilateral feet. Nails 1-5 bilateral  are thickened discolored and elongated with subungual debris. Hyperkeratotic cored lesions noted sub fifth metatarsal base bilateral feet. Hyperkeratoti lesion noted dorsum of right fourth digit with cored area.  Musculoskeletal: MMT 5/5 bilateral lower extremities in DF, PF, Inversion and Eversion. Deceased ROM in DF of ankle joint.  Neurological: Sensation intact to light touch. Protective sensation diminished bilateral.    Assessment:   1. Type 2 diabetes mellitus with hyperlipidemia (HCC)   2. Pain due to onychomycosis of toenails of both feet   3. Callus of foot      Plan:  Patient was evaluated and treated and all questions answered. -Discussed and educated patient on diabetic foot care, especially with  regards to the vascular, neurological and musculoskeletal systems.  -Stressed the importance of good glycemic control and the detriment of not  controlling glucose  levels in relation to the foot. -Discussed supportive shoes at all times and checking feet regularly.  -Mechanically debrided all nails 1-5 bilateral using sterile nail nipper and filed with dremel without incident  -Hyperkeratotic areas debrided without incident. Bilateral plantar areas were debrided x 2.  -Answered all patient questions -Patient to return  in 3 months for at risk foot care -Patient advised to call the office if any problems or questions arise in the meantime.   Lorenda Peck, DPM

## 2022-11-26 ENCOUNTER — Other Ambulatory Visit: Payer: Self-pay | Admitting: Internal Medicine

## 2022-11-26 MED ORDER — METFORMIN HCL 500 MG PO TABS: 500.0000 mg | ORAL_TABLET | Freq: Every day | ORAL | 0 refills | Status: DC

## 2022-11-26 NOTE — Telephone Encounter (Signed)
Next appt scheduled 4/10 with PCP.

## 2022-11-28 ENCOUNTER — Other Ambulatory Visit: Payer: Self-pay

## 2022-11-28 MED ORDER — OLMESARTAN-AMLODIPINE-HCTZ 40-10-12.5 MG PO TABS: 1.0000 | ORAL_TABLET | Freq: Every day | ORAL | 3 refills | Status: DC

## 2022-12-24 NOTE — Progress Notes (Unsigned)
[facility]: Clinic Note  Subjective:  History of Present Illness: Samuel Weiss is a 72 y.o. year old male who presents for routine follow up. I met him on 10/30/22.  Htn- stable  # HCM- discuss shingrix   # t2dm- can decrease ozempic to 1 if GI effects  Space out visits    Please refer to Assessment and Plan below for full details in Problem-Based Charting.   Past Medical History:  Patient Active Problem List   Diagnosis Date Noted   hyperlipidemia 04/15/2022   Class 1 obesity due to excess calories with body mass index (BMI) of 33.0 to 33.9 in adult 06/05/2021   Chronic pain of left knee 04/23/2021   Alcohol use 04/23/2021   Type 2 diabetes mellitus with hyperlipidemia 07/06/2019   Tobacco use disorder 11/17/2015   Essential hypertension 10/20/2015   Healthcare maintenance 10/20/2015    Social History: Reviewed in [facility]. Pertinent updates today include: ***  Family History: Reviewed in Epic. Pertinent updates today include: None  Medications:  Current Outpatient Medications:    atorvastatin (LIPITOR) 40 MG tablet, TAKE 1 TABLET BY MOUTH EVERY DAY, Disp: 90 tablet, Rfl: 4   metFORMIN (GLUCOPHAGE) 500 MG tablet, Take 1 tablet (500 mg total) by mouth daily with breakfast., Disp: 90 tablet, Rfl: 0   Olmesartan-amLODIPine-HCTZ 40-10-12.5 MG TABS, Take 1 tablet by mouth daily., Disp: 90 tablet, Rfl: 3   Allergies: No Known Allergies  Review of Systems: ROS   Objective:   Vitals: There were no vitals filed for this visit.  Physical Exam: Physical Exam   Data: Labs, imaging, and micro were reviewed in Epic. Refer to Assessment and Plan below for full details in Problem-Based Charting.  Assessment & Plan:  No problem-specific Assessment & Plan notes found for this encounter.     Patient will follow up in ***  Mercie Eon, MD

## 2022-12-25 ENCOUNTER — Encounter: Payer: Self-pay | Admitting: Internal Medicine

## 2022-12-25 ENCOUNTER — Other Ambulatory Visit: Payer: Self-pay

## 2022-12-25 ENCOUNTER — Ambulatory Visit (INDEPENDENT_AMBULATORY_CARE_PROVIDER_SITE_OTHER): Payer: Medicare Other | Admitting: Internal Medicine

## 2022-12-25 VITALS — BP 122/67 | HR 91 | Temp 98.0°F | Ht 68.0 in | Wt 217.8 lb

## 2022-12-25 DIAGNOSIS — E785 Hyperlipidemia, unspecified: Secondary | ICD-10-CM | POA: Diagnosis not present

## 2022-12-25 DIAGNOSIS — Z7985 Long-term (current) use of injectable non-insulin antidiabetic drugs: Secondary | ICD-10-CM | POA: Diagnosis not present

## 2022-12-25 DIAGNOSIS — E1169 Type 2 diabetes mellitus with other specified complication: Secondary | ICD-10-CM

## 2022-12-25 DIAGNOSIS — Z7984 Long term (current) use of oral hypoglycemic drugs: Secondary | ICD-10-CM

## 2022-12-25 DIAGNOSIS — I10 Essential (primary) hypertension: Secondary | ICD-10-CM

## 2022-12-25 DIAGNOSIS — E6609 Other obesity due to excess calories: Secondary | ICD-10-CM

## 2022-12-25 DIAGNOSIS — Z6833 Body mass index (BMI) 33.0-33.9, adult: Secondary | ICD-10-CM

## 2022-12-25 DIAGNOSIS — Z0000 Encounter for general adult medical examination without abnormal findings: Secondary | ICD-10-CM

## 2022-12-25 LAB — GLUCOSE, CAPILLARY: Glucose-Capillary: 140 mg/dL — ABNORMAL HIGH (ref 70–99)

## 2022-12-25 LAB — POCT GLYCOSYLATED HEMOGLOBIN (HGB A1C): Hemoglobin A1C: 6.4 % — AB (ref 4.0–5.6)

## 2022-12-25 MED ORDER — OLMESARTAN-AMLODIPINE-HCTZ 40-10-12.5 MG PO TABS: 1.0000 | ORAL_TABLET | Freq: Every day | ORAL | 3 refills | Status: DC

## 2022-12-25 MED ORDER — SEMAGLUTIDE (1 MG/DOSE) 4 MG/3ML ~~LOC~~ SOPN: 1.0000 mg | PEN_INJECTOR | SUBCUTANEOUS | 3 refills | Status: DC

## 2022-12-25 MED ORDER — SEMAGLUTIDE(0.25 OR 0.5MG/DOS) 2 MG/3ML ~~LOC~~ SOPN: PEN_INJECTOR | SUBCUTANEOUS | 3 refills | Status: AC

## 2022-12-25 NOTE — Assessment & Plan Note (Signed)
-   He is interested in getting Shingrix, will get this through his pharmacy

## 2022-12-25 NOTE — Assessment & Plan Note (Signed)
-   A1C at goal of <7, is 6.4 today - Ophtho referral placed for retinopathy screening - Continue Metformin 1000mg  BID - Restart Ozempic, ramp up provided in Wrap up and discussed with patient

## 2022-12-25 NOTE — Assessment & Plan Note (Signed)
-   Increase physical activity. He is doing walking 2x per week - Ozempic, as above

## 2022-12-25 NOTE — Assessment & Plan Note (Signed)
-   Chronic and stable - Continue Olmesartan-amlodipine-hctz 40-10-12.5 one tab daily 

## 2022-12-25 NOTE — Patient Instructions (Addendum)
Dear Samuel Weiss,  It was a pleasure seeing you today.  Keep doing your walks twice a week, and try to get some exercise 3-5 times a week.  I have sent your Ozempic to your pharmacy. Here is how you should take it: Week 1: 0.25mg  weekly Week 2: 0.25mg  weekly Week 3: 0.5mg  weekly Week 4: 0.5mg  weekly Week 5: 1mg  weekly (new pen!) Week 6: 1 mg weekly Week 7: 1 mg weekly Week 8: 1 mg weekly  After Week 8, if you would like to continue the Ozempic, please call our office for a refill. I will send you the 2mg  dosing if the Ozempic is going well.  When you get to the pharmacy, the pharmacist can answer any questions you have. I also recommend scheduling your Shingles shot with them.  Sincerely, Dr. Mercie Eon

## 2023-01-15 ENCOUNTER — Encounter: Payer: Self-pay | Admitting: Student

## 2023-01-15 ENCOUNTER — Ambulatory Visit (INDEPENDENT_AMBULATORY_CARE_PROVIDER_SITE_OTHER): Payer: Medicare Other | Admitting: Student

## 2023-01-15 ENCOUNTER — Other Ambulatory Visit: Payer: Self-pay

## 2023-01-15 VITALS — BP 130/78 | HR 89 | Temp 97.7°F | Ht 68.0 in | Wt 217.6 lb

## 2023-01-15 DIAGNOSIS — Z87891 Personal history of nicotine dependence: Secondary | ICD-10-CM

## 2023-01-15 DIAGNOSIS — Z0000 Encounter for general adult medical examination without abnormal findings: Secondary | ICD-10-CM

## 2023-01-15 DIAGNOSIS — R42 Dizziness and giddiness: Secondary | ICD-10-CM | POA: Diagnosis not present

## 2023-01-15 NOTE — Progress Notes (Unsigned)
   CC: Fall  HPI:  Samuel Weiss is a 72 y.o. male with PMH as below who presents to clinic for evaluation after recent fall. Please see problem based charting for evaluation, assessment and plan.  Past Medical History:  Diagnosis Date   Hyperlipidemia    Hypertension    Multiple facial fractures, open, initial encounter (HCC) 05/30/2018   Tobacco use disorder    Type 2 diabetes mellitus (HCC)     Review of Systems:  Constitutional: Negative for fever or fatigue Eyes: Negative for visual changes Respiratory: Negative for shortness of breath Cardiac: Negative for chest pain MSK: Positive for chronic knee pain Neuro: Positive for occasional dizziness. Negative for headache, numbness, tingling or weakness  Physical Exam: General: Pleasant, well-appearing elderly man. No acute distress. HEENT: Icteric sclera lab. Bilateral cataracts. PERRLA. EOMI with no nystagmus. Cardiac: RRR. No murmurs, rubs or gallops. No LE edema Respiratory: Lungs CTAB. No wheezing or crackles. Abdominal: Soft, symmetric and non tender. Normal BS. Skin: Warm, dry and intact without rashes or lesions Extremities: Atraumatic. Full ROM. Palpable radial and DP pulses. Neuro: A&O x 3. Moves all extremities. Normal sensation to gross touch. Psych: Appropriate mood and affect.  Vitals:   01/15/23 1022  BP: 130/78  Pulse: 89  Temp: 97.7 F (36.5 C)  TempSrc: Oral  SpO2: 98%  Weight: 217 lb 9.6 oz (98.7 kg)  Height: 5\' 8"  (1.727 m)    Assessment & Plan:   Dizziness Patient presenting for evaluation of dizziness and a fall.  Patient reports that he has a history of intermittent dizziness for many years and he has been noticing it more frequently the last few weeks. Tells me that he has been becoming lightheaded whenever he stands up from a sitting position or when he look up to fix his curtains.  This lasted for a few seconds.  Last week, after getting out of his car, he felt slightly dizzy and his  knees buckled causing him to fall. He was able to brace himself so he did not hit the ground.  He has been noticing these episodes about once a day. Denies any tinnitus, spinning sensation, palpitations, chest pain or shortness of breath.  States he only drinks about 3 glasses of water a day.  Of note, on chart review, patient has had the symptoms since 5 years ago. Workup with EKG, echo and Holter monitor did not show any cardiac cause of his symptoms. His his BP is normal and orthostatic vitals are negative today. His overall exam is reassuring.  I have advised patient to drink at least 3-4 bottles of water daily and monitor his blood pressure at home especially after an episode of lightheadedness.  Plan: -Increase fluid intake and follow-up if symptoms does not improve after a few weeks -Consider discontinuing the HCTZ part of his tribenzor if symptoms persist  Healthcare maintenance Ordered ultrasound for screening for AAA   See Encounters Tab for problem based charting.  Patient discussed with Dr. Lewie Chamber, MD, MPH

## 2023-01-15 NOTE — Patient Instructions (Addendum)
Thank you, [name] for allowing [place] to provide your care today. Today we discussed your blood pressure, diabetes and dizziness.    To help address the dizziness, I want you to drink at least 3 of 16 ounce water bottles daily. If your dizziness persists after a few weeks, follow-up so we can do further evaluation.  I have ordered the following tests: AAA ultrasound  My Chart Access: [email or web address]  Please follow-up in 3 months or as needed  Please make sure to arrive 15 minutes prior to your next appointment. If you arrive late, you may be asked to reschedule.    We look forward to seeing you next time. Please call our clinic at [phone] if you have any questions or concerns. The best time to call is Monday-Friday from 9am-4pm, but there is someone available 24/7. If after hours or the weekend, call the main hospital number and ask for the Internal Medicine Resident On-Call. If you need medication refills, please notify your pharmacy one week in advance and they will send [place] a request.   Thank you for letting us take part in your care. Wishing you the best!  Steffanie Rainwater, MD 01/15/2023, 11:24 AM IM Resident, PGY-3 [facility] Heck 41:10

## 2023-01-16 ENCOUNTER — Encounter: Payer: Self-pay | Admitting: Student

## 2023-01-16 NOTE — Assessment & Plan Note (Signed)
Patient presenting for evaluation of dizziness and a fall.  Patient reports that he has a history of intermittent dizziness for many years and he has been noticing it more frequently the last few weeks. Tells me that he has been becoming lightheaded whenever he stands up from a sitting position or when he look up to fix his curtains.  This lasted for a few seconds.  Last week, after getting out of his car, he felt slightly dizzy and his knees buckled causing him to fall. He was able to brace himself so he did not hit the ground.  He has been noticing these episodes about once a day. Denies any tinnitus, spinning sensation, palpitations, chest pain or shortness of breath.  States he only drinks about 3 glasses of water a day.  Of note, on chart review, patient has had the symptoms since 5 years ago. Workup with EKG, echo and Holter monitor did not show any cardiac cause of his symptoms. His his BP is normal and orthostatic vitals are negative today. His overall exam is reassuring.  I have advised patient to drink at least 3-4 bottles of water daily and monitor his blood pressure at home especially after an episode of lightheadedness.  Plan: -Increase fluid intake and follow-up if symptoms does not improve after a few weeks -Consider discontinuing the HCTZ part of his tribenzor if symptoms persist

## 2023-01-16 NOTE — Assessment & Plan Note (Signed)
Ordered ultrasound for screening for AAA

## 2023-01-17 NOTE — Progress Notes (Signed)
Internal Medicine Clinic Attending  Case discussed with the resident at the time of the visit.  We reviewed the resident's history and exam and pertinent patient test results.  I agree with the assessment, diagnosis, and plan of care documented in the resident's note.  

## 2023-01-29 ENCOUNTER — Telehealth: Payer: Self-pay | Admitting: Internal Medicine

## 2023-01-29 NOTE — Telephone Encounter (Signed)
Contacted Samuel Weiss to schedule their annual wellness visit. Appointment made for 02/18/2023.  [facility] Care Guide [facility] TEAM Direct Dial: [phone]

## 2023-01-30 ENCOUNTER — Ambulatory Visit (HOSPITAL_COMMUNITY)
Admission: RE | Admit: 2023-01-30 | Discharge: 2023-01-30 | Disposition: A | Discharge status: Home / Self Care | Payer: Medicare Other | Source: Ambulatory Visit | Attending: Internal Medicine | Admitting: Internal Medicine

## 2023-01-30 DIAGNOSIS — Z136 Encounter for screening for cardiovascular disorders: Secondary | ICD-10-CM

## 2023-01-30 DIAGNOSIS — Z87891 Personal history of nicotine dependence: Secondary | ICD-10-CM | POA: Diagnosis not present

## 2023-01-31 NOTE — Progress Notes (Signed)
Negative AAA screening. Results have been discussed with patient on the phone. His dizziness has improved with increase fluid intake.

## 2023-02-19 ENCOUNTER — Ambulatory Visit (INDEPENDENT_AMBULATORY_CARE_PROVIDER_SITE_OTHER): Payer: Medicare Other

## 2023-02-19 VITALS — Ht 68.0 in | Wt 217.0 lb

## 2023-02-19 DIAGNOSIS — Z0000 Encounter for general adult medical examination without abnormal findings: Secondary | ICD-10-CM

## 2023-02-19 NOTE — Progress Notes (Signed)
Subjective:   Samuel Weiss is a 72 y.o. male who presents for Medicare Annual/Subsequent preventive examination.  Review of Systems    Virtual Visit via Telephone Note  I connected with  Samuel Weiss on 02/19/23 at 10:45 AM EDT by telephone and verified that I am speaking with the correct person using two identifiers.  Location: Patient: Home Provider: Office Persons participating in the virtual visit: patient/Nurse Health Advisor   I discussed the limitations, risks, security and privacy concerns of performing an evaluation and management service by telephone and the availability of in person appointments. The patient expressed understanding and agreed to proceed.  Interactive audio and video telecommunications were attempted between this nurse and patient, however failed, due to patient having technical difficulties OR patient did not have access to video capability.  We continued and completed visit with audio only.  Some vital signs may be absent or patient reported.   Tillie Rung, LPN  Cardiac Risk Factors include: advanced age (>44men, >48 women);male gender;diabetes mellitus;hypertension     Objective:    Today's Vitals   02/19/23 1448 02/19/23 1449  Weight: 217 lb (98.4 kg)   Height: 5\' 8"  (1.727 m)   PainSc:  0-No pain   Body mass index is 32.99 kg/m.     02/19/2023    2:59 PM 12/25/2022   10:33 AM 10/30/2022   11:35 AM 09/05/2022   11:12 AM 08/05/2022    2:23 PM 04/15/2022    2:53 PM 01/02/2022    1:40 PM  Advanced Directives  Does Patient Have a Medical Advance Directive? No No No No No No No  Would patient like information on creating a medical advance directive? No - Patient declined No - Patient declined No - Patient declined No - Patient declined No - Patient declined  No - Patient declined    Current Medications (verified) Outpatient Encounter Medications as of 02/19/2023  Medication Sig   atorvastatin (LIPITOR) 40 MG tablet TAKE 1 TABLET BY  MOUTH EVERY DAY   metFORMIN (GLUCOPHAGE) 500 MG tablet Take 1 tablet (500 mg total) by mouth daily with breakfast.   Olmesartan-amLODIPine-HCTZ 40-10-12.5 MG TABS Take 1 tablet by mouth daily.   Semaglutide, 1 MG/DOSE, 4 MG/3ML SOPN Inject 1 mg into the skin once a week. Start this dose on May 15   No facility-administered encounter medications on file as of 02/19/2023.    Allergies (verified) Patient has no known allergies.   History: Past Medical History:  Diagnosis Date   Hyperlipidemia    Hypertension    Multiple facial fractures, open, initial encounter (HCC) 05/30/2018   Tobacco use disorder    Type 2 diabetes mellitus (HCC)    Past Surgical History:  Procedure Laterality Date   CLOSED REDUCTION MANDIBLE N/A 05/30/2018   Procedure: CLOSED REDUCTION MANDIBULAR FRACTURE;  Surgeon: Vivia Ewing, DMD;  Location: [facility] OR;  Service: Oral Surgery;  Laterality: N/A;   ORIF FACIAL FRACTURE N/A 05/30/2018   Procedure: OPEN REDUCTION INTERNAL FIXATION (ORIF) MULTIPLE FACIAL FRACTURES;  Surgeon: Vivia Ewing, DMD;  Location: [facility] OR;  Service: Oral Surgery;  Laterality: N/A;   Family History  Problem Relation Age of Onset   Hypertension Mother    Diabetes Mother    Hypertension Father    Kidney disease Sister    Diabetes Sister    Obesity Sister    Diabetes Sister    Heart disease Sister    Kidney disease Sister    Diabetes Sister  Kidney disease Brother    Kidney disease Brother    Cancer Maternal Aunt    Social History   Socioeconomic History   Marital status: Single    Spouse name: Not on file   Number of children: 4   Years of education: Not on file   Highest education level: Bachelor's degree (e.g., BA, AB, BS)  Occupational History   Not on file  Tobacco Use   Smoking status: Some Days    Packs/day: .15    Types: Cigarettes   Smokeless tobacco: Current  Vaping Use   Vaping Use: Never used  Substance and Sexual Activity   Alcohol use: Yes    Alcohol/week: 3.0  standard drinks of alcohol    Types: 3 Cans of beer per week   Drug use: Not Currently    Types: Marijuana   Sexual activity: Not Currently  Other Topics Concern   Not on file  Social History Narrative   Current Social History 04/02/2021        Patient lives with his sister " in a home which is 1 story/stories. There are 2 steps up to the entrance the patient uses.       Patient's method of transportation is personal car.      The highest level of education was college diploma.      The patient currently retired.      Identified important Relationships are his girlfriend       Pets : 0       Interests / Fun: "I shoot pool and go bowling every once in awhile."       Current Stressors: "I don't have any stress"       Religious / Personal Beliefs: "Wholist which is similar to baptist."   Social Determinants of Health   Financial Resource Strain: Low Risk  (02/19/2023)   Overall Financial Resource Strain (CARDIA)    Difficulty of Paying Living Expenses: Not hard at all  Food Insecurity: No Food Insecurity (02/19/2023)   Hunger Vital Sign    Worried About Running Out of Food in the Last Year: Never true    Ran Out of Food in the Last Year: Never true  Transportation Needs: No Transportation Needs (02/19/2023)   PRAPARE - Administrator, Civil Service (Medical): No    Lack of Transportation (Non-Medical): No  Physical Activity: Insufficiently Active (02/19/2023)   Exercise Vital Sign    Days of Exercise per Week: 3 days    Minutes of Exercise per Session: 40 min  Stress: No Stress Concern Present (02/19/2023)   [employer] of Occupational Health - Occupational Stress Questionnaire    Feeling of Stress : Not at all  Social Connections: Moderately Integrated (02/19/2023)   Social Connection and Isolation Panel [NHANES]    Frequency of Communication with Friends and Family: More than three times a week    Frequency of Social Gatherings with Friends and Family: More than  three times a week    Attends Religious Services: More than 4 times per year    Active Member of [employer] or Organizations: Yes    Attends Engineer, structural: More than 4 times per year    Marital Status: Never married  Recent Concern: Social Connections - Moderately Isolated (01/15/2023)   Social Connection and Isolation Panel [NHANES]    Frequency of Communication with Friends and Family: More than three times a week    Frequency of Social Gatherings with Friends and Family: Three  times a week    Attends Religious Services: More than 4 times per year    Active Member of Clubs or Organizations: No    Attends Banker Meetings: Never    Marital Status: Never married    Tobacco Counseling Ready to quit: No Counseling given: Yes   Clinical Intake:  Pre-visit preparation completed: No  Pain : No/denies pain Pain Score: 0-No pain   Nutrition Risk Assessment:  Has the patient had any N/V/D within the last 2 months?  No  Does the patient have any non-healing wounds?  No  Has the patient had any unintentional weight loss or weight gain?  No   Diabetes:  Is the patient diabetic?  Yes  If diabetic, was a CBG obtained today?  No  Did the patient bring in their glucometer from home?  No  How often do you monitor your CBG's? PRN.   Financial Strains and Diabetes Management:  Are you having any financial strains with the device, your supplies or your medication? No .  Does the patient want to be seen by Chronic Care Management for management of their diabetes?  No  Would the patient like to be referred to a Nutritionist or for Diabetic Management?  No   Diabetic Exams:  Diabetic Eye Exam: Completed . Overdue for diabetic eye exam. Pt has been advised about the importance in completing this exam. A referral has been placed today. Message sent to referral coordinator for scheduling purposes. Advised pt to expect a call from office referred to regarding  appt.  Diabetic Foot Exam: Completed . Pt has been advised about the importance in completing this exam. Pt is scheduled for diabetic foot exam on Followed by PCP.    BMI - recorded: 32.99 Nutritional Status: BMI > 30  Obese Nutritional Risks: None Diabetes: No  How often do you need to have someone help you when you read instructions, pamphlets, or other written materials from your doctor or pharmacy?: 1 - Never  Diabetic?  Yes  Interpreter Needed?: No  Information entered by :: Theresa Mulligan LPN   Activities of Daily Living    02/19/2023    2:56 PM 01/15/2023   10:30 AM  In your present state of health, do you have any difficulty performing the following activities:  Hearing? 0 0  Vision? 0 1  Difficulty concentrating or making decisions? 0 0  Walking or climbing stairs? 0 0  Dressing or bathing? 0 0  Doing errands, shopping? 0 0  Preparing Food and eating ? N   Using the Toilet? N   In the past six months, have you accidently leaked urine? N   Do you have problems with loss of bowel control? N   Managing your Medications? N   Managing your Finances? N   Housekeeping or managing your Housekeeping? N     Patient Care Team: Mercie Eon, MD as PCP - General (Internal Medicine)  Indicate any recent Medical Services you may have received from other than [facility] providers in the past year (date may be approximate).     Assessment:   This is a routine wellness examination for [place].  Hearing/Vision screen Hearing Screening - Comments:: Denies hearing difficulties   Vision Screening - Comments:: Wears rx glasses - up to date with routine eye exams with  [facility]  Dietary issues and exercise activities discussed: Current Exercise Habits: Home exercise routine, Type of exercise: walking, Time (Minutes): 40, Frequency (Times/Week): 3, Weekly Exercise (Minutes/Week):  120, Intensity: Moderate, Exercise limited by: None identified   Goals Addressed                This Visit's Progress     Lose weight (pt-stated)         Depression Screen    02/19/2023    2:54 PM 01/15/2023   10:29 AM 12/25/2022   11:16 AM 12/25/2022   10:31 AM 10/30/2022   11:36 AM 09/05/2022   11:10 AM 09/05/2022    9:52 AM  PHQ 2/9 Scores  PHQ - 2 Score 0 0 0 0 0 0 0    Fall Risk    02/19/2023    2:56 PM 01/15/2023   10:30 AM 12/25/2022   10:31 AM 10/30/2022   11:35 AM 09/05/2022    9:51 AM  Fall Risk   Falls in the past year? 1 1 0 0 0  Number falls in past yr: 0 0 0 0 0  Injury with Fall? 0 0 0 0 0  Comment No injury. Followed by medical attention      Risk for fall due to : No Fall Risks Mental status change     Follow up Falls prevention discussed Falls prevention discussed;Falls evaluation completed Falls evaluation completed Falls evaluation completed Falls evaluation completed    FALL RISK PREVENTION PERTAINING TO THE HOME:  Any stairs in or around the home? Yes  If so, are there any without handrails? No  Home free of loose throw rugs in walkways, pet beds, electrical cords, etc? Yes  Adequate lighting in your home to reduce risk of falls? Yes   ASSISTIVE DEVICES UTILIZED TO PREVENT FALLS:  Life alert? No  Use of a cane, walker or w/c? No  Grab bars in the bathroom? No  Shower chair or bench in shower? No  Elevated toilet seat or a handicapped toilet? No   TIMED UP AND GO:  Was the test performed? No . Audio Visit  Cognitive Function:        02/19/2023    2:59 PM 05/23/2021    3:43 PM 04/02/2021   10:54 AM  6CIT Screen  What Year? 0 points 0 points 0 points  What month? 0 points 0 points 0 points  What time? 0 points 0 points 0 points  Count back from 20 0 points 0 points 0 points  Months in reverse 0 points 0 points 0 points  Repeat phrase 0 points 0 points 2 points  Total Score 0 points 0 points 2 points    Immunizations Immunization History  Administered Date(s) Administered   Influenza, High Dose Seasonal PF 05/31/2018    Influenza,inj,Quad PF,6+ Mos 10/20/2015, 07/06/2019, 05/29/2020   Influenza-Unspecified 06/04/2021   PFIZER(Purple Top)SARS-COV-2 Vaccination 11/07/2019, 12/01/2019   PNEUMOCOCCAL CONJUGATE-20 04/23/2021   Pneumococcal Conjugate-13 07/06/2019   Tdap 06/04/2021    TDAP status: Up to date  Flu Vaccine status: Up to date  Pneumococcal vaccine status: Up to date  Covid-19 vaccine status: Completed vaccines  Qualifies for Shingles Vaccine? Yes   Zostavax completed No   Shingrix Completed?: No.    Education has been provided regarding the importance of this vaccine. Patient has been advised to call insurance company to determine out of pocket expense if they have not yet received this vaccine. Advised may also receive vaccine at local pharmacy or Health Dept. Verbalized acceptance and understanding.  Screening Tests Health Maintenance  Topic Date Due   OPHTHALMOLOGY EXAM  09/18/2022   COVID-19 Vaccine (3 - 2023-24  season) 03/07/2023 (Originally 05/17/2022)   Zoster Vaccines- Shingrix (1 of 2) 05/22/2023 (Originally 12/05/1998)   Diabetic kidney evaluation - eGFR measurement  04/16/2023   INFLUENZA VACCINE  04/17/2023   HEMOGLOBIN A1C  06/26/2023   Diabetic kidney evaluation - Urine ACR  09/06/2023   FOOT EXAM  10/31/2023   Medicare Annual Wellness (AWV)  02/19/2024   Colonoscopy  06/20/2030   DTaP/Tdap/Td (2 - Td or Tdap) 06/05/2031   Pneumonia Vaccine 70+ Years old  Completed   Hepatitis C Screening  Completed   HPV VACCINES  Aged Out    Health Maintenance  Health Maintenance Due  Topic Date Due   OPHTHALMOLOGY EXAM  09/18/2022    Colorectal cancer screening: Type of screening: Colonoscopy. Completed 06/20/20. Repeat every 10 years  Lung Cancer Screening: (Low Dose CT Chest recommended if Age 3-80 years, 30 pack-year currently smoking OR have quit w/in 15years.) does qualify.   Lung Cancer Screening Referral: Deferred  Additional Screening:  Hepatitis C Screening: does  qualify; Completed 09/18/16  Vision Screening: Recommended annual ophthalmology exams for early detection of glaucoma and other disorders of the eye. Is the patient up to date with their annual eye exam?  Yes  Who is the provider or what is the name of the office in which the patient attends annual eye exams? [facility] If pt is not established with a provider, would they like to be referred to a provider to establish care? No .   Dental Screening: Recommended annual dental exams for proper oral hygiene  Community Resource Referral / Chronic Care Management:  CRR required this visit?  No   CCM required this visit?  No      Plan:     I have personally reviewed and noted the following in the patient's chart:   Medical and social history Use of alcohol, tobacco or illicit drugs  Current medications and supplements including opioid prescriptions. Patient is not currently taking opioid prescriptions. Functional ability and status Nutritional status Physical activity Advanced directives List of other physicians Hospitalizations, surgeries, and ER visits in previous 12 months Vitals Screenings to include cognitive, depression, and falls Referrals and appointments  In addition, I have reviewed and discussed with patient certain preventive protocols, quality metrics, and best practice recommendations. A written personalized care plan for preventive services as well as general preventive health recommendations were provided to patient.     Tillie Rung, LPN   05/22/453   Nurse Notes:   None

## 2023-02-19 NOTE — Patient Instructions (Addendum)
[name] , Thank you for taking time to come for your Medicare Wellness Visit. I appreciate your ongoing commitment to your health goals. Please review the following plan we discussed and let me know if I can assist you in the future.   These are the goals we discussed:  Goals       Exercise 5x per week (30 min per time)      "I am in the process of losing weight."  Has the silver sneakers program at the [facility]       Lose weight (pt-stated)      Quit Smoking      Patient is currently working towards smoking cessation. Declines any assistance with this and states he prefers to do this on his own.        This is a list of the screening recommended for you and due dates:  Health Maintenance  Topic Date Due   Eye exam for diabetics  09/18/2022   COVID-19 Vaccine (3 - 2023-24 season) 03/07/2023*   Zoster (Shingles) Vaccine (1 of 2) 05/22/2023*   Yearly kidney function blood test for diabetes  04/16/2023   Flu Shot  04/17/2023   Hemoglobin A1C  06/26/2023   Yearly kidney health urinalysis for diabetes  09/06/2023   Complete foot exam   10/31/2023   Medicare Annual Wellness Visit  02/19/2024   Colon Cancer Screening  06/20/2030   DTaP/Tdap/Td vaccine (2 - Td or Tdap) 06/05/2031   Pneumonia Vaccine  Completed   Hepatitis C Screening  Completed   HPV Vaccine  Aged Out  *Topic was postponed. The date shown is not the original due date.    Advanced directives: Advance directive discussed with you today. Even though you declined this today, please call our office should you change your mind, and we can give you the proper paperwork for you to fill out.   Conditions/risks identified: None  Next appointment: Follow up in one year for your annual wellness visit.   Preventive Care 34 Years and Older, Male  Preventive care refers to lifestyle choices and visits with your health care provider that can promote health and wellness. What does preventive care include? A yearly physical  exam. This is also called an annual well check. Dental exams once or twice a year. Routine eye exams. Ask your health care provider how often you should have your eyes checked. Personal lifestyle choices, including: Daily care of your teeth and gums. Regular physical activity. Eating a healthy diet. Avoiding tobacco and drug use. Limiting alcohol use. Practicing safe sex. Taking low doses of aspirin every day. Taking vitamin and mineral supplements as recommended by your health care provider. What happens during an annual well check? The services and screenings done by your health care provider during your annual well check will depend on your age, overall health, lifestyle risk factors, and family history of disease. Counseling  Your health care provider may ask you questions about your: Alcohol use. Tobacco use. Drug use. Emotional well-being. Home and relationship well-being. Sexual activity. Eating habits. History of falls. Memory and ability to understand (cognition). Work and work Astronomer. Screening  You may have the following tests or measurements: Height, weight, and BMI. Blood pressure. Lipid and cholesterol levels. These may be checked every 5 years, or more frequently if you are over 53 years old. Skin check. Lung cancer screening. You may have this screening every year starting at age 50 if you have a 30-pack-year history of smoking and currently  smoke or have quit within the past 15 years. Fecal occult blood test (FOBT) of the stool. You may have this test every year starting at age 72. Flexible sigmoidoscopy or colonoscopy. You may have a sigmoidoscopy every 5 years or a colonoscopy every 10 years starting at age 71. Prostate cancer screening. Recommendations will vary depending on your family history and other risks. Hepatitis C blood test. Hepatitis B blood test. Sexually transmitted disease (STD) testing. Diabetes screening. This is done by checking your  blood sugar (glucose) after you have not eaten for a while (fasting). You may have this done every 1-3 years. Abdominal aortic aneurysm (AAA) screening. You may need this if you are a current or former smoker. Osteoporosis. You may be screened starting at age 32 if you are at high risk. Talk with your health care provider about your test results, treatment options, and if necessary, the need for more tests. Vaccines  Your health care provider may recommend certain vaccines, such as: Influenza vaccine. This is recommended every year. Tetanus, diphtheria, and acellular pertussis (Tdap, Td) vaccine. You may need a Td booster every 10 years. Zoster vaccine. You may need this after age 36. Pneumococcal 13-valent conjugate (PCV13) vaccine. One dose is recommended after age 39. Pneumococcal polysaccharide (PPSV23) vaccine. One dose is recommended after age 44. Talk to your health care provider about which screenings and vaccines you need and how often you need them. This information is not intended to replace advice given to you by your health care provider. Make sure you discuss any questions you have with your health care provider. Document Released: 09/29/2015 Document Revised: 05/22/2016 Document Reviewed: 07/04/2015 Elsevier Interactive Patient Education  2017 [employer].  Fall Prevention in the Home Falls can cause injuries. They can happen to people of all ages. There are many things you can do to make your home safe and to help prevent falls. What can I do on the outside of my home? Regularly fix the edges of walkways and driveways and fix any cracks. Remove anything that might make you trip as you walk through a door, such as a raised step or threshold. Trim any bushes or trees on the path to your home. Use bright outdoor lighting. Clear any walking paths of anything that might make someone trip, such as rocks or tools. Regularly check to see if handrails are loose or broken. Make sure  that both sides of any steps have handrails. Any raised decks and porches should have guardrails on the edges. Have any leaves, snow, or ice cleared regularly. Use sand or salt on walking paths during winter. Clean up any spills in your garage right away. This includes oil or grease spills. What can I do in the bathroom? Use night lights. Install grab bars by the toilet and in the tub and shower. Do not use towel bars as grab bars. Use non-skid mats or decals in the tub or shower. If you need to sit down in the shower, use a plastic, non-slip stool. Keep the floor dry. Clean up any water that spills on the floor as soon as it happens. Remove soap buildup in the tub or shower regularly. Attach bath mats securely with double-sided non-slip rug tape. Do not have throw rugs and other things on the floor that can make you trip. What can I do in the bedroom? Use night lights. Make sure that you have a light by your bed that is easy to reach. Do not use any sheets or  blankets that are too big for your bed. They should not hang down onto the floor. Have a firm chair that has side arms. You can use this for support while you get dressed. Do not have throw rugs and other things on the floor that can make you trip. What can I do in the kitchen? Clean up any spills right away. Avoid walking on wet floors. Keep items that you use a lot in easy-to-reach places. If you need to reach something above you, use a strong step stool that has a grab bar. Keep electrical cords out of the way. Do not use floor polish or wax that makes floors slippery. If you must use wax, use non-skid floor wax. Do not have throw rugs and other things on the floor that can make you trip. What can I do with my stairs? Do not leave any items on the stairs. Make sure that there are handrails on both sides of the stairs and use them. Fix handrails that are broken or loose. Make sure that handrails are as long as the  stairways. Check any carpeting to make sure that it is firmly attached to the stairs. Fix any carpet that is loose or worn. Avoid having throw rugs at the top or bottom of the stairs. If you do have throw rugs, attach them to the floor with carpet tape. Make sure that you have a light switch at the top of the stairs and the bottom of the stairs. If you do not have them, ask someone to add them for you. What else can I do to help prevent falls? Wear shoes that: Do not have high heels. Have rubber bottoms. Are comfortable and fit you well. Are closed at the toe. Do not wear sandals. If you use a stepladder: Make sure that it is fully opened. Do not climb a closed stepladder. Make sure that both sides of the stepladder are locked into place. Ask someone to hold it for you, if possible. Clearly mark and make sure that you can see: Any grab bars or handrails. First and last steps. Where the edge of each step is. Use tools that help you move around (mobility aids) if they are needed. These include: Canes. Walkers. Scooters. Crutches. Turn on the lights when you go into a dark area. Replace any light bulbs as soon as they burn out. Set up your furniture so you have a clear path. Avoid moving your furniture around. If any of your floors are uneven, fix them. If there are any pets around you, be aware of where they are. Review your medicines with your doctor. Some medicines can make you feel dizzy. This can increase your chance of falling. Ask your doctor what other things that you can do to help prevent falls. This information is not intended to replace advice given to you by your health care provider. Make sure you discuss any questions you have with your health care provider. Document Released: 06/29/2009 Document Revised: 02/08/2016 Document Reviewed: 10/07/2014 Elsevier Interactive Patient Education  2017 [employer].

## 2023-02-25 ENCOUNTER — Ambulatory Visit: Payer: Medicare Other | Admitting: Podiatry

## 2023-02-26 DIAGNOSIS — E119 Type 2 diabetes mellitus without complications: Secondary | ICD-10-CM | POA: Diagnosis not present

## 2023-02-26 DIAGNOSIS — H43822 Vitreomacular adhesion, left eye: Secondary | ICD-10-CM | POA: Diagnosis not present

## 2023-02-26 DIAGNOSIS — H04123 Dry eye syndrome of bilateral lacrimal glands: Secondary | ICD-10-CM | POA: Diagnosis not present

## 2023-02-26 DIAGNOSIS — H25813 Combined forms of age-related cataract, bilateral: Secondary | ICD-10-CM | POA: Diagnosis not present

## 2023-02-26 LAB — HM DIABETES EYE EXAM

## 2023-03-03 ENCOUNTER — Other Ambulatory Visit: Payer: Self-pay

## 2023-03-03 MED ORDER — METFORMIN HCL 500 MG PO TABS: 500.0000 mg | ORAL_TABLET | Freq: Every day | ORAL | 0 refills | Status: DC

## 2023-03-24 ENCOUNTER — Encounter: Payer: Self-pay | Admitting: Dietician

## 2023-05-29 ENCOUNTER — Other Ambulatory Visit: Payer: Self-pay

## 2023-05-29 MED ORDER — METFORMIN HCL 500 MG PO TABS: 500.0000 mg | ORAL_TABLET | Freq: Every day | ORAL | 0 refills | Status: DC

## 2023-06-02 ENCOUNTER — Other Ambulatory Visit: Payer: Self-pay

## 2023-06-02 DIAGNOSIS — E785 Hyperlipidemia, unspecified: Secondary | ICD-10-CM

## 2023-06-03 MED ORDER — ATORVASTATIN CALCIUM 40 MG PO TABS: 40.0000 mg | ORAL_TABLET | Freq: Every day | ORAL | 4 refills | Status: DC

## 2023-06-03 NOTE — Telephone Encounter (Signed)
Pt calling back about his medication refill. Pt states his pharmacy said to call his PCP.    Atorvastatin Calcium 40 mg Oral Daily  [facility] [address] [place], [place] - [address], [place] [address] Phone: [phone]  Fax: [phone]

## 2023-06-24 NOTE — Progress Notes (Unsigned)
[facility]: Clinic Note  Subjective:  History of Present Illness: Samuel Weiss is a 71 y.o. year old male who presents for routine follow up. I've seen him twice, last in April.   He wasn't able to pick up his Ozempic, has gained a few pounds. He'd like to go back on the Ozempic.  I completed temporary disability placard for him today for 6 months, and explained how I'd like him to get 30 minutes of physical activity 3-4x per week, so my hope is that he wouldn't continue to need a placard, as I hope his mobility improves over time.  He recently had a cold, would like cough suppressant at night.   Please refer to Assessment and Plan below for full details in Problem-Based Charting.   Past Medical History:  Patient Active Problem List   Diagnosis Date Noted   hyperlipidemia 04/15/2022   Class 1 obesity due to excess calories with body mass index (BMI) of 33.0 to 33.9 in adult 06/05/2021   Chronic pain of left knee 04/23/2021   Alcohol use 04/23/2021   Type 2 diabetes mellitus with hyperlipidemia (HCC) 07/06/2019   Dizziness 04/07/2019   Tobacco use disorder 11/17/2015   Essential hypertension 10/20/2015   Healthcare maintenance 10/20/2015      Medications:  Current Outpatient Medications:    benzonatate (TESSALON PERLES) 100 MG capsule, Take 1 capsule (100 mg total) by mouth every 6 (six) hours as needed for cough., Disp: 30 capsule, Rfl: 1   dextromethorphan (ROBITUSSIN 12 HOUR COUGH) 30 MG/5ML liquid, Take 2.5 mLs (15 mg total) by mouth at bedtime as needed for cough., Disp: 89 mL, Rfl: 0   Semaglutide,0.25 or 0.5MG /DOS, 2 MG/3ML SOPN, Inject 0.5 mg into the skin once a week., Disp: 9 mL, Rfl: 0   atorvastatin (LIPITOR) 40 MG tablet, Take 1 tablet (40 mg total) by mouth daily., Disp: 90 tablet, Rfl: 4   metFORMIN (GLUCOPHAGE) 500 MG tablet, Take 1 tablet (500 mg total) by mouth daily with breakfast., Disp: 90 tablet, Rfl: 0    Olmesartan-amLODIPine-HCTZ 40-10-12.5 MG TABS, Take 1 tablet by mouth daily., Disp: 90 tablet, Rfl: 3   Allergies: No Known Allergies     Objective:   Vitals: Vitals:   06/25/23 1011  BP: 132/74  Pulse: 81  Temp: 98 F (36.7 C)  SpO2: 98%     Physical Exam: Physical Exam Constitutional:      Appearance: Normal appearance.  Cardiovascular:     Rate and Rhythm: Normal rate and regular rhythm.  Pulmonary:     Effort: Pulmonary effort is normal.     Breath sounds: Normal breath sounds.  Neurological:     Mental Status: He is alert.  Psychiatric:        Mood and Affect: Mood normal.        Behavior: Behavior normal.      Data: Labs, imaging, and micro were reviewed in Epic. Refer to Assessment and Plan below for full details in Problem-Based Charting.  Assessment & Plan:  Essential hypertension - Chronic and stable - Continue Olmesartan-amlodipine-hctz 40-10-12.5 one tab daily  Healthcare maintenance - discuss shingrix next time  Type 2 diabetes mellitus with hyperlipidemia (HCC) - A1C 6.6 today, continue Metformin 1000mg  BID  - Restart Ozempic 0.25mg  weekly x4 weeks, then 0.5mg  weekly. We will wait until he does this for 90 days, then increase to 1mg  weekly for insurance reasons   Class 1 obesity due to excess calories with body mass  index (BMI) of 33.0 to 33.9 in adult - Increase Ozempic, as above       Patient will follow up in 3 months   Mercie Eon, MD

## 2023-06-25 ENCOUNTER — Ambulatory Visit: Payer: Medicare Other | Admitting: Internal Medicine

## 2023-06-25 ENCOUNTER — Encounter: Payer: Self-pay | Admitting: Internal Medicine

## 2023-06-25 VITALS — BP 132/74 | HR 81 | Temp 98.0°F | Ht 68.0 in | Wt 220.2 lb

## 2023-06-25 DIAGNOSIS — Z0000 Encounter for general adult medical examination without abnormal findings: Secondary | ICD-10-CM

## 2023-06-25 DIAGNOSIS — E6609 Other obesity due to excess calories: Secondary | ICD-10-CM

## 2023-06-25 DIAGNOSIS — Z6833 Body mass index (BMI) 33.0-33.9, adult: Secondary | ICD-10-CM

## 2023-06-25 DIAGNOSIS — E66811 Obesity, class 1: Secondary | ICD-10-CM

## 2023-06-25 DIAGNOSIS — I10 Essential (primary) hypertension: Secondary | ICD-10-CM

## 2023-06-25 DIAGNOSIS — Z7985 Long-term (current) use of injectable non-insulin antidiabetic drugs: Secondary | ICD-10-CM

## 2023-06-25 DIAGNOSIS — E785 Hyperlipidemia, unspecified: Secondary | ICD-10-CM | POA: Diagnosis not present

## 2023-06-25 DIAGNOSIS — E1169 Type 2 diabetes mellitus with other specified complication: Secondary | ICD-10-CM | POA: Diagnosis not present

## 2023-06-25 LAB — GLUCOSE, CAPILLARY: Glucose-Capillary: 108 mg/dL — ABNORMAL HIGH (ref 70–99)

## 2023-06-25 LAB — POCT GLYCOSYLATED HEMOGLOBIN (HGB A1C): Hemoglobin A1C: 6.6 % — AB (ref 4.0–5.6)

## 2023-06-25 MED ORDER — BENZONATATE 100 MG PO CAPS: 100.0000 mg | ORAL_CAPSULE | Freq: Four times a day (QID) | ORAL | 1 refills | Status: DC | PRN

## 2023-06-25 MED ORDER — SEMAGLUTIDE(0.25 OR 0.5MG/DOS) 2 MG/3ML ~~LOC~~ SOPN: 0.5000 mg | PEN_INJECTOR | SUBCUTANEOUS | 0 refills | Status: AC

## 2023-06-25 MED ORDER — DEXTROMETHORPHAN POLISTIREX ER 30 MG/5ML PO SUER: 15.0000 mg | Freq: Every evening | ORAL | 0 refills | Status: DC | PRN

## 2023-06-25 NOTE — Assessment & Plan Note (Signed)
-   Chronic and stable - Continue Olmesartan-amlodipine-hctz 40-10-12.5 one tab daily

## 2023-06-25 NOTE — Assessment & Plan Note (Signed)
-   A1C 6.6 today, continue Metformin 1000mg  BID  - Restart Ozempic 0.25mg  weekly x4 weeks, then 0.5mg  weekly. We will wait until he does this for 90 days, then increase to 1mg  weekly for insurance reasons

## 2023-06-25 NOTE — Assessment & Plan Note (Signed)
-   Increase Ozempic, as above

## 2023-06-25 NOTE — Patient Instructions (Addendum)
Dear Samuel Weiss,  It was a pleasure seeing you in clinic today.  I have sent your Ozempic pen to your pharmacy. When you use the pen, you need to dial the correct dose: - For the first 4 weeks, inject 0.25mg  once weekly - Starting on 07/23/2023, inject 0.5mg  once weekly - When you are about to run out of the medicine, please call us, and we will give you a refill. On that refill, I will increase to 1mg  weekly, which is a different pen  Sincerely, Dr. Mercie Eon

## 2023-06-25 NOTE — Assessment & Plan Note (Signed)
-   discuss shingrix next time

## 2023-06-27 LAB — BMP8+ANION GAP
Anion Gap: 22 mmol/L — ABNORMAL HIGH (ref 10.0–18.0)
BUN/Creatinine Ratio: 14 (ref 10–24)
BUN: 15 mg/dL (ref 8–27)
CO2: 18 mmol/L — ABNORMAL LOW (ref 20–29)
Calcium: 9.6 mg/dL (ref 8.6–10.2)
Chloride: 103 mmol/L (ref 96–106)
Creatinine, Ser: 1.07 mg/dL (ref 0.76–1.27)
Glucose: 100 mg/dL — ABNORMAL HIGH (ref 70–99)
Potassium: 4.3 mmol/L (ref 3.5–5.2)
Sodium: 143 mmol/L (ref 134–144)
eGFR: 73 mL/min/{1.73_m2} (ref >59)

## 2023-07-28 ENCOUNTER — Encounter: Payer: Self-pay | Admitting: Podiatry

## 2023-07-28 ENCOUNTER — Ambulatory Visit (INDEPENDENT_AMBULATORY_CARE_PROVIDER_SITE_OTHER): Payer: Medicare Other | Admitting: Podiatry

## 2023-07-28 DIAGNOSIS — M79675 Pain in left toe(s): Secondary | ICD-10-CM | POA: Diagnosis not present

## 2023-07-28 DIAGNOSIS — B351 Tinea unguium: Secondary | ICD-10-CM | POA: Diagnosis not present

## 2023-07-28 DIAGNOSIS — L84 Corns and callosities: Secondary | ICD-10-CM | POA: Diagnosis not present

## 2023-07-28 DIAGNOSIS — E1169 Type 2 diabetes mellitus with other specified complication: Secondary | ICD-10-CM

## 2023-07-28 DIAGNOSIS — E785 Hyperlipidemia, unspecified: Secondary | ICD-10-CM

## 2023-07-28 DIAGNOSIS — M79674 Pain in right toe(s): Secondary | ICD-10-CM

## 2023-07-28 NOTE — Progress Notes (Signed)
  Subjective:  Patient ID: Samuel Weiss, male    DOB: October 20, 1950,   MRN: [id number]  Chief Complaint  Patient presents with   Debridement    Trim toenails/calluses - 3rd toe left-medial border "its not growing quite right", diabetic -6.6    72 y.o. male presents for concern of thickened elongated and painful nails that are difficult to trim. Also complains of callus. Requesting to have them trimmed today. Relates burning and tingling in their feet. Patient is diabetic and last A1c was  Lab Results  Component Value Date   HGBA1C 6.6 (A) 06/25/2023   .   PCP:  Mercie Eon, MD    . Denies any other pedal complaints. Denies n/v/f/c.   Past Medical History:  Diagnosis Date   Hyperlipidemia    Hypertension    Multiple facial fractures, open, initial encounter (HCC) 05/30/2018   Tobacco use disorder    Type 2 diabetes mellitus (HCC)     Objective:  Physical Exam: Vascular: DP/PT pulses 2/4 bilateral. CFT <3 seconds. Absent hair growth on digits. Edema noted to bilateral lower extremities. Xerosis noted bilaterally.  Skin. No lacerations or abrasions bilateral feet. Nails 1-5 bilateral  are thickened discolored and elongated with subungual debris. Hyperkeratotic cored lesions noted sub fifth metatarsal base bilateral feet. Hyperkeratoti lesion noted dorsum of right fourth digit with cored area.  Musculoskeletal: MMT 5/5 bilateral lower extremities in DF, PF, Inversion and Eversion. Deceased ROM in DF of ankle joint.  Neurological: Sensation intact to light touch. Protective sensation diminished bilateral.    Assessment:   1. Pain due to onychomycosis of toenails of both feet   2. Callus of foot   3. Type 2 diabetes mellitus with hyperlipidemia (HCC)       Plan:  Patient was evaluated and treated and all questions answered. -Discussed and educated patient on diabetic foot care, especially with  regards to the vascular, neurological and musculoskeletal systems.  -Stressed  the importance of good glycemic control and the detriment of not  controlling glucose levels in relation to the foot. -Discussed supportive shoes at all times and checking feet regularly.  -Mechanically debrided all nails 1-5 bilateral using sterile nail nipper and filed with dremel without incident  -Hyperkeratotic areas debrided without incident. Bilateral plantar areas were debrided x 2.  -Answered all patient questions -Patient to return  in 3 months for at risk foot care -Patient advised to call the office if any problems or questions arise in the meantime.   Louann Sjogren, DPM

## 2023-10-29 ENCOUNTER — Ambulatory Visit (INDEPENDENT_AMBULATORY_CARE_PROVIDER_SITE_OTHER): Payer: 59 | Admitting: Podiatry

## 2023-10-29 ENCOUNTER — Encounter: Payer: Self-pay | Admitting: Podiatry

## 2023-10-29 DIAGNOSIS — E785 Hyperlipidemia, unspecified: Secondary | ICD-10-CM

## 2023-10-29 DIAGNOSIS — M79675 Pain in left toe(s): Secondary | ICD-10-CM | POA: Diagnosis not present

## 2023-10-29 DIAGNOSIS — E1169 Type 2 diabetes mellitus with other specified complication: Secondary | ICD-10-CM | POA: Diagnosis not present

## 2023-10-29 DIAGNOSIS — L84 Corns and callosities: Secondary | ICD-10-CM | POA: Diagnosis not present

## 2023-10-29 DIAGNOSIS — M79674 Pain in right toe(s): Secondary | ICD-10-CM

## 2023-10-29 DIAGNOSIS — B351 Tinea unguium: Secondary | ICD-10-CM | POA: Diagnosis not present

## 2023-10-29 NOTE — Progress Notes (Signed)
  Subjective:  Patient ID: Samuel Weiss, male    DOB: Aug 31, 1951,   MRN: [id number]  Chief Complaint  Patient presents with   Nail Problem    rfc    73 y.o. male presents for concern of thickened elongated and painful nails that are difficult to trim. Also complains of callus. Requesting to have them trimmed today. Relates burning and tingling in their feet. Patient is diabetic and last A1c was  Lab Results  Component Value Date   HGBA1C 6.6 (A) 06/25/2023   .   PCP:  Mercie Eon, MD    . Denies any other pedal complaints. Denies n/v/f/c.   Past Medical History:  Diagnosis Date   Hyperlipidemia    Hypertension    Multiple facial fractures, open, initial encounter (HCC) 05/30/2018   Tobacco use disorder    Type 2 diabetes mellitus (HCC)     Objective:  Physical Exam: Vascular: DP/PT pulses 2/4 bilateral. CFT <3 seconds. Absent hair growth on digits. Edema noted to bilateral lower extremities. Xerosis noted bilaterally.  Skin. No lacerations or abrasions bilateral feet. Nails 1-5 bilateral  are thickened discolored and elongated with subungual debris. Hyperkeratotic cored lesions noted sub fifth metatarsal base bilateral feet. Hyperkeratoti lesion noted dorsum of right fourth digit with cored area.  Musculoskeletal: MMT 5/5 bilateral lower extremities in DF, PF, Inversion and Eversion. Deceased ROM in DF of ankle joint.  Neurological: Sensation intact to light touch. Protective sensation diminished bilateral.    Assessment:   1. Pain due to onychomycosis of toenails of both feet   2. Type 2 diabetes mellitus with hyperlipidemia (HCC)       Plan:  Patient was evaluated and treated and all questions answered. -Discussed and educated patient on diabetic foot care, especially with  regards to the vascular, neurological and musculoskeletal systems.  -Stressed the importance of good glycemic control and the detriment of not  controlling glucose levels in relation to the  foot. -Discussed supportive shoes at all times and checking feet regularly.  -Mechanically debrided all nails 1-5 bilateral using sterile nail nipper and filed with dremel without incident  -Hyperkeratotic areas debrided without incident. Bilateral plantar areas were debrided x 2.  -Answered all patient questions -Patient to return  in 3 months for at risk foot care -Patient advised to call the office if any problems or questions arise in the meantime.   Louann Sjogren, DPM

## 2023-11-04 ENCOUNTER — Telehealth: Payer: Self-pay

## 2023-11-04 ENCOUNTER — Encounter: Payer: Self-pay | Admitting: Student

## 2023-11-04 ENCOUNTER — Ambulatory Visit (INDEPENDENT_AMBULATORY_CARE_PROVIDER_SITE_OTHER): Payer: 59 | Admitting: Student

## 2023-11-04 VITALS — BP 153/72 | HR 91 | Temp 98.1°F | Ht 68.0 in | Wt 221.4 lb

## 2023-11-04 DIAGNOSIS — E1169 Type 2 diabetes mellitus with other specified complication: Secondary | ICD-10-CM

## 2023-11-04 DIAGNOSIS — E785 Hyperlipidemia, unspecified: Secondary | ICD-10-CM

## 2023-11-04 DIAGNOSIS — I10 Essential (primary) hypertension: Secondary | ICD-10-CM | POA: Diagnosis not present

## 2023-11-04 DIAGNOSIS — Z7984 Long term (current) use of oral hypoglycemic drugs: Secondary | ICD-10-CM | POA: Diagnosis not present

## 2023-11-04 DIAGNOSIS — Z0000 Encounter for general adult medical examination without abnormal findings: Secondary | ICD-10-CM

## 2023-11-04 MED ORDER — OZEMPIC (0.25 OR 0.5 MG/DOSE) 2 MG/1.5ML ~~LOC~~ SOPN: 0.2500 mg | PEN_INJECTOR | SUBCUTANEOUS | 0 refills | Status: DC

## 2023-11-04 NOTE — Progress Notes (Signed)
     CC: Routine Follow Up for diabetes after last office visit in October 2024  HPI:  Samuel Weiss is a 73 y.o. male with pertinent PMH of T2DM, HTN who presents to the clinic for diabetes follow up. Please see assessment and plan below for further details.  Review of Systems:   Pertinent items noted in HPI and/or A&P.  Physical Exam:  Vitals:   11/04/23 0944  BP: (!) 153/72  Pulse: 91  Temp: 98.1 F (36.7 C)  TempSrc: Oral  SpO2: 99%  Weight: 221 lb 6.4 oz (100.4 kg)  Height: 5\' 8"  (1.727 m)    Constitutional: Well-appearing, in no acute distress HEENT: Normocephalic, atraumatic, Sclera non-icteric, PERRL, EOM intact Cardio: Regular rate and rhythm. 2+ bilateral dorsalis pedis pulses. Pulm: Clear to auscultation bilaterally. Normal work of breathing on room air. Abdomen: Soft, non-tender, non-distended, positive bowel sounds. MSK: Negative for extremity edema. Skin: Warm and dry. Neuro: Alert and oriented x3. No focal deficit noted. Foot: Bilateral feet warm, pulses intact.  No sensory deficits appreciated.  No ulcerations.  2 calluses, one on the lateral aspect of the plantar service of each foot, shaved down by podiatry.  Assessment & Plan:   Essential hypertension Chronic history of hypertension.  Previously well-controlled on olmesartan-amlodipine-hydrochlorothiazide 40-10-12.5 mg daily.  He is asymptomatic.  His blood pressure was elevated at 153/72.  He states that it is usually normal at home.  At his last visit it was 132/74.  He has been taking his medication as prescribed.    We will hold off on any medication changes for now, as I suspect this may be a transient elevation.  Will have him record his blood pressures and will recheck his blood pressure at his next visit and make adjustments of his medication as necessary.  BMP collected to assess his electrolytes in the setting of his diuretic use.  Type 2 diabetes mellitus with hyperlipidemia  (HCC) Hemoglobin A1c 6.6 at his last visit, we will recheck it today.  He has been off Ozempic for several weeks as he ran out of refills.  We will restart Ozempic at 0.25 mg weekly, and titrate it up as tolerated for better glycemic control.  He states that his insurance prefers that he refill Ozempic every 90 days, so we will wait for 90 days to go up on his next dose.    He is taking metformin 500 mg daily with no concerns, so we will continue it as well.    Foot exam done today.    Microalbumin/creatinine collected.  BMP to assess kidney function.  Healthcare maintenance Discussed Shingrix with the patient, he is open to getting it but left prior to administration today. Follow-up on this at his next visit.   Patient discussed with Dr. Carleene Overlie, MD [facility] Internal [facility] Resident PGY-1 Clinic Phone: [phone] Pager: [phone]

## 2023-11-04 NOTE — Telephone Encounter (Signed)
Prior Authorization for patient (Ozempic (0.25 or 0.5 MG/DOSE) 2MG /3ML pen-injectors) came through on cover my meds was submitted with last office notes and labs awaiting approval or denial.  WUJ:[id number]

## 2023-11-04 NOTE — Telephone Encounter (Signed)
[employer] (Key: [id number]) Rx #: [id number] Ozempic (0.25 or 0.5 MG/DOSE) 2MG Ronny Bacon pen-injectors Form [employer] Medicare Part D Electronic Prior Authorization Form (2017 NCPDP) Created Message from Plan Request Reference Number: [id number]. OZEMPIC INJ 2MG /3ML is approved through 09/15/2024. Your patient may now fill this prescription and it will be covered.. Authorization Expiration Date: September 15, 2024.

## 2023-11-04 NOTE — Assessment & Plan Note (Addendum)
Chronic history of hypertension.  Previously well-controlled on olmesartan-amlodipine-hydrochlorothiazide 40-10-12.5 mg daily.  He is asymptomatic.  His blood pressure was elevated at 153/72.  He states that it is usually normal at home.  At his last visit it was 132/74.  He has been taking his medication as prescribed.    We will hold off on any medication changes for now, as I suspect this may be a transient elevation.  Will have him record his blood pressures and will recheck his blood pressure at his next visit and make adjustments of his medication as necessary.  BMP collected to assess his electrolytes in the setting of his diuretic use.

## 2023-11-04 NOTE — Assessment & Plan Note (Addendum)
Discussed Shingrix with the patient, he is open to getting it but left prior to administration today. Follow-up on this at his next visit.

## 2023-11-04 NOTE — Patient Instructions (Addendum)
Thank you, [name] for allowing [place] to provide your care today. Today we discussed your diabetes and high blood pressure.    We have collected a few labs including hemoglobin A1c and a basic metabolic panel.  I have also collected a microalbumin/creatinine to evaluate your kidney function.  I will call you with the results.  I have sent in a prescription for Ozempic 0.25 mg weekly.  You may take this for the next 90 days, and then we can increase to 0.5 mg weekly if you tolerate it well.  You may also continue taking metformin as prescribed.  Your blood pressure was elevated today.  Once we get your lab results, I will give you a call about whether he needs to change her blood pressure medications.  A foot exam was completed today.  I have ordered the following labs for you:   Lab Orders         Hemoglobin A1c         BMP8+Anion Gap      I have ordered the following medication/changed the following medications:   Start the following medications: Meds ordered this encounter  Medications   Semaglutide,0.25 or 0.5MG /DOS, (OZEMPIC, 0.25 OR 0.5 MG/DOSE,) 2 MG/1.5ML SOPN    Sig: Inject 0.25 mg into the skin once a week.    Dispense:  2.25 mL    Refill:  0     Follow up: 3 months   We look forward to seeing you next time. Please call our clinic at [phone] if you have any questions or concerns. The best time to call is Monday-Friday from 9am-4pm, but there is someone available 24/7. If after hours or the weekend, call the main hospital number and ask for the Internal Medicine Resident On-Call. If you need medication refills, please notify your pharmacy one week in advance and they will send [place] a request.   Thank you for trusting me with your care. Wishing you the best!   Annett Fabian, MD [facility]

## 2023-11-04 NOTE — Assessment & Plan Note (Addendum)
Hemoglobin A1c 6.6 at his last visit, we will recheck it today.  He has been off Ozempic for several weeks as he ran out of refills.  We will restart Ozempic at 0.25 mg weekly, and titrate it up as tolerated for better glycemic control.  He states that his insurance prefers that he refill Ozempic every 90 days, so we will wait for 90 days to go up on his next dose.    He is taking metformin 500 mg daily with no concerns, so we will continue it as well.    Foot exam done today.    Microalbumin/creatinine collected.  BMP to assess kidney function.

## 2023-11-05 LAB — BMP8+ANION GAP
Anion Gap: 17 mmol/L (ref 10.0–18.0)
BUN/Creatinine Ratio: 17 (ref 10–24)
BUN: 16 mg/dL (ref 8–27)
CO2: 23 mmol/L (ref 20–29)
Calcium: 10 mg/dL (ref 8.6–10.2)
Chloride: 102 mmol/L (ref 96–106)
Creatinine, Ser: 0.92 mg/dL (ref 0.76–1.27)
Glucose: 96 mg/dL (ref 70–99)
Potassium: 4.4 mmol/L (ref 3.5–5.2)
Sodium: 142 mmol/L (ref 134–144)
eGFR: 87 mL/min/{1.73_m2} (ref >59)

## 2023-11-05 LAB — HEMOGLOBIN A1C
Est. average glucose Bld gHb Est-mCnc: 146 mg/dL
Hgb A1c MFr Bld: 6.7 % — ABNORMAL HIGH (ref 4.8–5.6)

## 2023-11-05 LAB — MICROALBUMIN / CREATININE URINE RATIO
Creatinine, Urine: 83.7 mg/dL
Microalb/Creat Ratio: <4 mg/g{creat} (ref 0–29)
Microalbumin, Urine: <3 ug/mL

## 2023-11-05 NOTE — Progress Notes (Signed)
 Internal Medicine Clinic Attending  Case discussed with the resident at the time of the visit.  We reviewed the resident's history and exam and pertinent patient test results.  I agree with the assessment, diagnosis, and plan of care documented in the resident's note.

## 2023-11-06 ENCOUNTER — Encounter: Payer: Self-pay | Admitting: Student

## 2023-11-27 ENCOUNTER — Other Ambulatory Visit: Payer: Self-pay | Admitting: Internal Medicine

## 2023-11-27 ENCOUNTER — Other Ambulatory Visit: Payer: Self-pay

## 2023-11-27 DIAGNOSIS — E785 Hyperlipidemia, unspecified: Secondary | ICD-10-CM

## 2023-11-27 MED ORDER — METFORMIN HCL 500 MG PO TABS: 500.0000 mg | ORAL_TABLET | Freq: Every day | ORAL | 0 refills | Status: DC

## 2023-11-27 MED ORDER — ATORVASTATIN CALCIUM 40 MG PO TABS: 40.0000 mg | ORAL_TABLET | Freq: Every day | ORAL | 4 refills | Status: AC

## 2023-11-27 NOTE — Telephone Encounter (Signed)
 Medication sent to pharmacy

## 2023-11-27 NOTE — Telephone Encounter (Signed)
 Copied from CRM [id number]. Topic: Clinical - Medication Refill >> Nov 27, 2023  1:33 PM Antony Haste wrote: Most Recent Primary Care Visit:  Provider: Annett Fabian  Department: [facility]  Visit Type: OPEN ESTABLISHED  Date: 11/04/2023  Medication: metFORMIN (GLUCOPHAGE) 500 MG tablet  Has the patient contacted their pharmacy? Yes (Agent: If no, request that the patient contact the pharmacy for the refill. If patient does not wish to contact the pharmacy document the reason why and proceed with request.) (Agent: If yes, when and what did the pharmacy advise?)  Is this the correct pharmacy for this prescription? Yes If no, delete pharmacy and type the correct one.  This is the patient's preferred pharmacy:   [phone] [place], [place] - [address] [place] [address] Phone: [phone] Fax: [phone]   Has the prescription been filled recently? No  Is the patient out of the medication? Yes  Has the patient been seen for an appointment in the last year OR does the patient have an upcoming appointment? No  Can we respond through MyChart? No, callback preferred.  Agent: Please be advised that Rx refills may take up to 3 business days. We ask that you follow-up with your pharmacy.

## 2023-12-01 ENCOUNTER — Telehealth: Payer: Self-pay | Admitting: Internal Medicine

## 2023-12-01 NOTE — Telephone Encounter (Signed)
 Copied from CRM [id number]. Topic: General - Other >> Dec 01, 2023  2:12 PM Alcus Dad H wrote: Reason for CRM: Patient says he received a call about 40 minutes ago. I do not see any telephone encounters or any notes regarding a phone call. He said he would wait to see if he gets a call back   Called the pt back to notify him that no one from the [facility] has contacted him today.Marland Kitchen

## 2023-12-16 ENCOUNTER — Other Ambulatory Visit: Payer: Self-pay | Admitting: Internal Medicine

## 2023-12-16 MED ORDER — OZEMPIC (0.25 OR 0.5 MG/DOSE) 2 MG/1.5ML ~~LOC~~ SOPN: 0.5000 mg | PEN_INJECTOR | SUBCUTANEOUS | 0 refills | Status: DC

## 2023-12-16 NOTE — Telephone Encounter (Signed)
 Copied from CRM [id number]. Topic: Clinical - Medication Refill >> Dec 16, 2023  9:55 AM Philippa Chester F wrote: Most Recent Primary Care Visit:  Provider: Annett Fabian  Department[facility]  Visit Type: OPEN ESTABLISHED  Date: 11/04/2023  Patient is inquiring about the dosage increase he was informed of the last visit. He was told by a physician that he should increase his dosage with the next refill. Patient is also requesting a 3 month supply to be placed with his refill as he ends up paying the same amount for a 30 day supply as he does a 90 day day supply.    Medication: Semaglutide,0.25 or 0.5MG /DOS, (OZEMPIC, 0.25 OR 0.5 MG/DOSE,) 2 MG/1.5ML SOPN  Has the patient contacted their pharmacy? No (Agent: If no, request that the patient contact the pharmacy for the refill. If patient does not wish to contact the pharmacy document the reason why and proceed with request.) (Agent: If yes, when and what did the pharmacy advise?)  Is this the correct pharmacy for this prescription? Yes  This is the patient's preferred pharmacy:  [facility] [address] [place], [place] - [address] [place] [address] Phone: [phone] Fax: [phone]  Has the prescription been filled recently? No  Is the patient out of the medication? Yes  Has the patient been seen for an appointment in the last year OR does the patient have an upcoming appointment? Yes  Can we respond through MyChart? Yes  Agent: Please be advised that Rx refills may take up to 3 business days. We ask that you follow-up with your pharmacy.

## 2023-12-16 NOTE — Telephone Encounter (Signed)
 Last Fill: 11/04/23 2.25 mL/0 RF  Last OV: 11/04/23 Next OV: None Scheduled  Routing to provider for review/authorization.

## 2024-01-12 ENCOUNTER — Other Ambulatory Visit: Payer: Self-pay

## 2024-01-12 MED ORDER — SEMAGLUTIDE (1 MG/DOSE) 4 MG/3ML ~~LOC~~ SOPN: 1.0000 mg | PEN_INJECTOR | SUBCUTANEOUS | 0 refills | Status: DC

## 2024-01-21 ENCOUNTER — Ambulatory Visit (INDEPENDENT_AMBULATORY_CARE_PROVIDER_SITE_OTHER): Payer: 59 | Admitting: Podiatry

## 2024-01-21 DIAGNOSIS — Z91199 Patient's noncompliance with other medical treatment and regimen due to unspecified reason: Secondary | ICD-10-CM

## 2024-01-21 NOTE — Progress Notes (Signed)
 No show

## 2024-02-03 NOTE — Progress Notes (Signed)
  Internal Medicine Center: Clinic Note  Subjective:  History of Present Illness: Samuel Weiss is a 73 y.o. year old male who presents for routine follow up of chronic medical conditions. He saw Dr. Duncan Gibson in February.   He's doing great.  For his blood pressure, he's been taking the combo pill. No dizziness or side effects.  For his T2DM, he's been taking Metformin  500mg  daily and has gotten up to Ozempic  1mg  weekly. He's tolerating this well without side effects.  He sees Podiatry for calluses on his feet, which limit his mobility. He is going to call them today to reschedule the appointment he missed. When his feet are not bothering him, he likes walking 1-2 miles each day. He has lots of friends in the area, so stays active.   He is requesting a renewal of his disability placard. He drives his disabled friend around, and has trouble walking with him to the store from the main parking spots.   He is in good spirits.     Please refer to Assessment and Plan below for full details in Problem-Based Charting.   Past Medical History:  Patient Active Problem List   Diagnosis Date Noted   hyperlipidemia 04/15/2022   Class 1 obesity due to excess calories with body mass index (BMI) of 33.0 to 33.9 in adult 06/05/2021   Type 2 diabetes mellitus with hyperlipidemia (HCC) 07/06/2019   Tobacco use disorder 11/17/2015   Essential hypertension 10/20/2015   Healthcare maintenance 10/20/2015      Medications:  Current Outpatient Medications:    atorvastatin  (LIPITOR) 40 MG tablet, Take 1 tablet (40 mg total) by mouth daily., Disp: 90 tablet, Rfl: 4   metFORMIN  (GLUCOPHAGE ) 500 MG tablet, Take 1 tablet (500 mg total) by mouth daily with breakfast., Disp: 90 tablet, Rfl: 3   Olmesartan -amLODIPine -HCTZ 40-10-12.5 MG TABS, Take 1 tablet by mouth daily., Disp: 90 tablet, Rfl: 3   Semaglutide , 1 MG/DOSE, 4 MG/3ML SOPN, Inject 1 mg into the skin once a week. We are slowly increasing  your Ozempic  dose. Please call the [facility] if you have any nausea, bloating, or abdominal pain., Disp: 9 mL, Rfl: 0   Allergies: No Known Allergies     Objective:   Vitals: Vitals:   02/04/24 1046 02/04/24 1057  BP: (!) 140/76 134/82  Pulse: 83 76  Temp: 98.2 F (36.8 C)   SpO2: 97%      Physical Exam: Physical Exam Constitutional:      Appearance: Normal appearance.  Cardiovascular:     Rate and Rhythm: Normal rate and regular rhythm.  Pulmonary:     Effort: Pulmonary effort is normal.     Breath sounds: Normal breath sounds.  Musculoskeletal:     Right lower leg: No edema.     Left lower leg: No edema.  Neurological:     Mental Status: He is alert.      Data: Labs, imaging, and micro were reviewed in Epic. Refer to Assessment and Plan below for full details in Problem-Based Charting.  Assessment & Plan:  Type 2 diabetes mellitus with hyperlipidemia (HCC) - Chronic and stable - Continue Ozempic  1mg  weekly for now. Patient will let us  know if he wants to increase to 2mg  weekly for his next 90-day refill - Metformin  500mg  daily - A1C today (pending)  Essential hypertension - Chronic and stable - Continue olmesartan -amlodipine -hydrochlorothiazide  40-10-12.5mg  daily   Tobacco use disorder - He continues to smoke a few cigarettes daily, 1 pack lasts  3-4 days. He has been smoking this way for 50 years. - smoking cessation counseling offered  hyperlipidemia - Continue atorvastatin  40mg  daily - Check lipids at next visit      Patient will follow up in 6 months. Labs due then.  Shadrach Bartunek, MD

## 2024-02-04 ENCOUNTER — Ambulatory Visit (INDEPENDENT_AMBULATORY_CARE_PROVIDER_SITE_OTHER): Payer: Self-pay | Admitting: Internal Medicine

## 2024-02-04 ENCOUNTER — Ambulatory Visit: Payer: Self-pay | Admitting: Internal Medicine

## 2024-02-04 ENCOUNTER — Encounter: Payer: Self-pay | Admitting: Internal Medicine

## 2024-02-04 VITALS — BP 134/82 | HR 76 | Temp 98.2°F | Ht 68.0 in | Wt 216.0 lb

## 2024-02-04 DIAGNOSIS — Z13220 Encounter for screening for lipoid disorders: Secondary | ICD-10-CM

## 2024-02-04 DIAGNOSIS — F17210 Nicotine dependence, cigarettes, uncomplicated: Secondary | ICD-10-CM

## 2024-02-04 DIAGNOSIS — E785 Hyperlipidemia, unspecified: Secondary | ICD-10-CM

## 2024-02-04 DIAGNOSIS — E1169 Type 2 diabetes mellitus with other specified complication: Secondary | ICD-10-CM | POA: Diagnosis not present

## 2024-02-04 DIAGNOSIS — Z7985 Long-term (current) use of injectable non-insulin antidiabetic drugs: Secondary | ICD-10-CM

## 2024-02-04 DIAGNOSIS — I10 Essential (primary) hypertension: Secondary | ICD-10-CM

## 2024-02-04 DIAGNOSIS — F17200 Nicotine dependence, unspecified, uncomplicated: Secondary | ICD-10-CM

## 2024-02-04 DIAGNOSIS — Z7984 Long term (current) use of oral hypoglycemic drugs: Secondary | ICD-10-CM

## 2024-02-04 LAB — POCT GLYCOSYLATED HEMOGLOBIN (HGB A1C): Hemoglobin A1C: 6.3 % — AB (ref 4.0–5.6)

## 2024-02-04 LAB — GLUCOSE, CAPILLARY: Glucose-Capillary: 103 mg/dL — ABNORMAL HIGH (ref 70–99)

## 2024-02-04 MED ORDER — METFORMIN HCL 500 MG PO TABS
500.0000 mg | ORAL_TABLET | Freq: Every day | ORAL | 3 refills | Status: AC
Start: 2024-02-04 — End: ?

## 2024-02-04 NOTE — Assessment & Plan Note (Signed)
-   Continue atorvastatin  40mg  daily - Check lipids at next visit

## 2024-02-04 NOTE — Patient Instructions (Signed)
 Thank you, Samuel Weiss for allowing us  to provide your care today. Today we discussed your blood pressure and your diabetes.    I have ordered the following labs for you:   Lab Orders         POC Hbg A1C        Referrals ordered today:   Referral Orders  No referral(s) requested today     I have ordered the following medication/changed the following medications:   Refill sent in:  Meds ordered this encounter  Medications   metFORMIN  (GLUCOPHAGE ) 500 MG tablet    Sig: Take 1 tablet (500 mg total) by mouth daily with breakfast.    Dispense:  90 tablet    Refill:  3     Return in about 6 months (around 08/06/2024) for Chronic medical conditions.    Remember:  - If you take the Ozempic  1mg  weekly for 4 weeks and want to increase to 2mg  weekly, call our office, and I will make that change - Continue to stay active! Please call your Podiatrist so they can remove your calluses and get you walking more  Should you have any questions or concerns please call the internal medicine clinic at [phone].     Jumanah Hynson, MD Faculty, Internal Medicine Teaching Progam [facility]

## 2024-02-04 NOTE — Assessment & Plan Note (Signed)
-   Chronic and stable - Continue olmesartan -amlodipine -hydrochlorothiazide  40-10-12.5mg  daily

## 2024-02-04 NOTE — Assessment & Plan Note (Signed)
-   He continues to smoke a few cigarettes daily, 1 pack lasts 3-4 days. He has been smoking this way for 50 years. - smoking cessation counseling offered

## 2024-02-04 NOTE — Assessment & Plan Note (Signed)
-   Chronic and stable - Continue Ozempic  1mg  weekly for now. Patient will let us  know if he wants to increase to 2mg  weekly for his next 90-day refill - Metformin  500mg  daily - A1C today (pending)

## 2024-02-25 ENCOUNTER — Ambulatory Visit (INDEPENDENT_AMBULATORY_CARE_PROVIDER_SITE_OTHER): Admitting: Podiatry

## 2024-02-25 ENCOUNTER — Encounter: Payer: Self-pay | Admitting: Podiatry

## 2024-02-25 DIAGNOSIS — L84 Corns and callosities: Secondary | ICD-10-CM

## 2024-02-25 DIAGNOSIS — M79675 Pain in left toe(s): Secondary | ICD-10-CM

## 2024-02-25 DIAGNOSIS — B351 Tinea unguium: Secondary | ICD-10-CM

## 2024-02-25 DIAGNOSIS — M79674 Pain in right toe(s): Secondary | ICD-10-CM | POA: Diagnosis not present

## 2024-02-25 DIAGNOSIS — E785 Hyperlipidemia, unspecified: Secondary | ICD-10-CM

## 2024-02-25 DIAGNOSIS — E1169 Type 2 diabetes mellitus with other specified complication: Secondary | ICD-10-CM | POA: Diagnosis not present

## 2024-02-25 NOTE — Progress Notes (Signed)
  Subjective:  Patient ID: Samuel Weiss, male    DOB: [birth year],   MRN: [id number]  No chief complaint on file.   73 y.o. male presents for concern of thickened elongated and painful nails that are difficult to trim. Also complains of callus. Requesting to have them trimmed today. Relates burning and tingling in their feet. Patient is diabetic and last A1c was  Lab Results  Component Value Date   HGBA1C 6.3 (A) 02/04/2024   .   PCP:  Driscilla George, MD    . Denies any other pedal complaints. Denies n/v/f/c.   Past Medical History:  Diagnosis Date   Alcohol use 04/23/2021   Chronic pain of left knee 04/23/2021   Pt reports pain onset earlier today as he was walking around the track. It resolves with rest, worsens with walking/weightbearing. He has not tried anything to help with the pain.         Dizziness 04/07/2019   Hyperlipidemia    Hypertension    Multiple facial fractures, open, initial encounter (HCC) 05/30/2018   Tobacco use disorder    Type 2 diabetes mellitus (HCC)     Objective:  Physical Exam: Vascular: DP/PT pulses 2/4 bilateral. CFT <3 seconds. Absent hair growth on digits. Edema noted to bilateral lower extremities. Xerosis noted bilaterally.  Skin. No lacerations or abrasions bilateral feet. Nails 1-5 bilateral  are thickened discolored and elongated with subungual debris. Hyperkeratotic cored lesions noted sub fifth metatarsal base bilateral feet. Hyperkeratoti lesion noted dorsum of right fourth digit with cored area.  Musculoskeletal: MMT 5/5 bilateral lower extremities in DF, PF, Inversion and Eversion. Deceased ROM in DF of ankle joint.  Neurological: Sensation intact to light touch. Protective sensation diminished bilateral.    Assessment:   1. Pain due to onychomycosis of toenails of both feet   2. Type 2 diabetes mellitus with hyperlipidemia (HCC)   3. Callus of foot       Plan:  Patient was evaluated and treated and all questions  answered. -Discussed and educated patient on diabetic foot care, especially with  regards to the vascular, neurological and musculoskeletal systems.  -Stressed the importance of good glycemic control and the detriment of not  controlling glucose levels in relation to the foot. -Discussed supportive shoes at all times and checking feet regularly.  -Mechanically debrided all nails 1-5 bilateral using sterile nail nipper and filed with dremel without incident  -Hyperkeratotic areas debrided without incident. Bilateral plantar areas were debrided x 2.  -Answered all patient questions -Patient to return  in 3 months for at risk foot care -Patient advised to call the office if any problems or questions arise in the meantime.   Jennefer Moats, DPM

## 2024-03-22 ENCOUNTER — Other Ambulatory Visit: Payer: Self-pay | Admitting: Internal Medicine

## 2024-03-22 NOTE — Telephone Encounter (Signed)
 Copied from CRM [id number]. Topic: Clinical - Medication Refill >> Mar 22, 2024  4:09 PM Merlynn A wrote: Medication: Olmesartan -amLODIPine -HCTZ 40-10-12.5 MG TABS  Has the patient contacted their pharmacy? Yes (Agent: If no, request that the patient contact the pharmacy for the refill. If patient does not wish to contact the pharmacy document the reason why and proceed with request.) (Agent: If yes, when and what did the pharmacy advise?)  Patient contacted pharmacy and was advised that the refill was requested. Patient is out of medication and needs refill. Patient has been waiting since last week for refill.   This is the patient's preferred pharmacy:  [phone] [place], [place] - [address] [place] [address] Phone: [phone] Fax: [phone]  Is this the correct pharmacy for this prescription? Yes If no, delete pharmacy and type the correct one.   Has the prescription been filled recently? Yes  Is the patient out of the medication? Yes  Has the patient been seen for an appointment in the last year OR does the patient have an upcoming appointment? Yes  Can we respond through MyChart? No  Agent: Please be advised that Rx refills may take up to 3 business days. We ask that you follow-up with your pharmacy.

## 2024-03-23 ENCOUNTER — Other Ambulatory Visit: Payer: Self-pay

## 2024-03-23 MED ORDER — OLMESARTAN-AMLODIPINE-HCTZ 40-10-12.5 MG PO TABS: 1.0000 | ORAL_TABLET | Freq: Every day | ORAL | 0 refills | Status: DC

## 2024-03-23 NOTE — Telephone Encounter (Signed)
 Copied from CRM [id number]. Topic: Clinical - Medication Refill >> Mar 22, 2024  4:09 PM Merlynn A wrote: Medication: Olmesartan -amLODIPine -HCTZ 40-10-12.5 MG TABS  Has the patient contacted their pharmacy? Yes (Agent: If no, request that the patient contact the pharmacy for the refill. If patient does not wish to contact the pharmacy document the reason why and proceed with request.) (Agent: If yes, when and what did the pharmacy advise?)  Patient contacted pharmacy and was advised that the refill was requested. Patient is out of medication and needs refill. Patient has been waiting since last week for refill.   This is the patient's preferred pharmacy:  [phone] [place], [place] - [address] [place] [address] Phone: [phone] Fax: [phone]  Is this the correct pharmacy for this prescription? Yes If no, delete pharmacy and type the correct one.   Has the prescription been filled recently? Yes  Is the patient out of the medication? Yes  Has the patient been seen for an appointment in the last year OR does the patient have an upcoming appointment? Yes  Can we respond through MyChart? No  Agent: Please be advised that Rx refills may take up to 3 business days. We ask that you follow-up with your pharmacy. >> Mar 23, 2024 11:24 AM Carrielelia G wrote: [facility]: Olmesartan -amLODIPine -HCTZ 40-10-12.5 MG TABS  Patient has not taken it in two days. He ask can you please call this into [facility] pharmacy.    [facility] - [place], [place] - [address]  [address] [place] [address]  Phone: [phone] Fax: [phone]  Hours: Not open 24 hours   Please advise    >> Mar 22, 2024  4:39 PM Fredrica W wrote: Patient called back to check status of request. Let him know it was received and sent to provider.Patient states pharmacy states they requested it last wednesday  and have not received a response. Unable to contact [facility] - closed at 4:30. Thank You

## 2024-03-24 ENCOUNTER — Other Ambulatory Visit: Payer: Self-pay

## 2024-03-24 MED ORDER — OLMESARTAN-AMLODIPINE-HCTZ 40-10-12.5 MG PO TABS: 1.0000 | ORAL_TABLET | Freq: Every day | ORAL | 0 refills | Status: DC

## 2024-03-24 NOTE — Telephone Encounter (Signed)
 Patient called regarding his rx for olmesartan -amlodopine [facility] [address]. [facility] [address] [facility].  Medication sent to pharmacy.

## 2024-03-25 LAB — HM DIABETES EYE EXAM

## 2024-03-29 ENCOUNTER — Encounter: Payer: Self-pay | Admitting: *Deleted

## 2024-04-06 ENCOUNTER — Other Ambulatory Visit: Payer: Self-pay

## 2024-04-06 MED ORDER — SEMAGLUTIDE (1 MG/DOSE) 4 MG/3ML ~~LOC~~ SOPN: 1.0000 mg | PEN_INJECTOR | SUBCUTANEOUS | 0 refills | Status: DC

## 2024-04-16 ENCOUNTER — Ambulatory Visit: Payer: Self-pay

## 2024-05-06 ENCOUNTER — Ambulatory Visit: Admitting: Student

## 2024-05-06 VITALS — BP 138/82 | HR 83 | Temp 97.5°F | Ht 68.0 in | Wt 208.2 lb

## 2024-05-06 DIAGNOSIS — E785 Hyperlipidemia, unspecified: Secondary | ICD-10-CM | POA: Diagnosis not present

## 2024-05-06 DIAGNOSIS — Z7984 Long term (current) use of oral hypoglycemic drugs: Secondary | ICD-10-CM

## 2024-05-06 DIAGNOSIS — E1169 Type 2 diabetes mellitus with other specified complication: Secondary | ICD-10-CM

## 2024-05-06 DIAGNOSIS — I10 Essential (primary) hypertension: Secondary | ICD-10-CM

## 2024-05-06 DIAGNOSIS — Z7985 Long-term (current) use of injectable non-insulin antidiabetic drugs: Secondary | ICD-10-CM

## 2024-05-06 DIAGNOSIS — Z13220 Encounter for screening for lipoid disorders: Secondary | ICD-10-CM

## 2024-05-06 DIAGNOSIS — F17210 Nicotine dependence, cigarettes, uncomplicated: Secondary | ICD-10-CM | POA: Diagnosis not present

## 2024-05-06 DIAGNOSIS — F17200 Nicotine dependence, unspecified, uncomplicated: Secondary | ICD-10-CM

## 2024-05-06 LAB — POCT GLYCOSYLATED HEMOGLOBIN (HGB A1C): HbA1c, POC (controlled diabetic range): 6.1 % (ref 0.0–7.0)

## 2024-05-06 LAB — GLUCOSE, CAPILLARY: Glucose-Capillary: 91 mg/dL (ref 70–99)

## 2024-05-06 MED ORDER — OZEMPIC (2 MG/DOSE) 8 MG/3ML ~~LOC~~ SOPN: 2.0000 mg | PEN_INJECTOR | SUBCUTANEOUS | 6 refills | Status: DC

## 2024-05-06 NOTE — Progress Notes (Signed)
 CC: Follow-up  HPI:  Samuel Weiss is a 73 y.o. male living with a history stated below and presents today for follow-up. Please see problem based assessment and plan for additional details.  Past Medical History:  Diagnosis Date   Alcohol use 04/23/2021   Chronic pain of left knee 04/23/2021   Pt reports pain onset earlier today as he was walking around the track. It resolves with rest, worsens with walking/weightbearing. He has not tried anything to help with the pain.         Dizziness 04/07/2019   Hyperlipidemia    Hypertension    Multiple facial fractures, open, initial encounter (HCC) 05/30/2018   Tobacco use disorder    Type 2 diabetes mellitus (HCC)     Current Outpatient Medications on File Prior to Visit  Medication Sig Dispense Refill   atorvastatin  (LIPITOR) 40 MG tablet Take 1 tablet (40 mg total) by mouth daily. 90 tablet 4   metFORMIN  (GLUCOPHAGE ) 500 MG tablet Take 1 tablet (500 mg total) by mouth daily with breakfast. 90 tablet 3   Olmesartan -amLODIPine -HCTZ 40-10-12.5 MG TABS Take 1 tablet by mouth daily. 90 tablet 0   No current facility-administered medications on file prior to visit.    Family History  Problem Relation Age of Onset   Hypertension Mother    Diabetes Mother    Hypertension Father    Kidney disease Sister    Diabetes Sister    Obesity Sister    Diabetes Sister    Heart disease Sister    Kidney disease Sister    Diabetes Sister    Kidney disease Brother    Kidney disease Brother    Cancer Maternal Aunt     Social History   Socioeconomic History   Marital status: Single    Spouse name: Not on file   Number of children: 4   Years of education: Not on file   Highest education level: Bachelor's degree (e.g., BA, AB, BS)  Occupational History   Not on file  Tobacco Use   Smoking status: Some Days    Current packs/day: 0.15    Types: Cigarettes   Smokeless tobacco: Current  Vaping Use   Vaping status: Never Used   Substance and Sexual Activity   Alcohol use: Yes    Alcohol/week: 3.0 standard drinks of alcohol    Types: 3 Cans of beer per week   Drug use: Not Currently    Types: Marijuana   Sexual activity: Not Currently  Other Topics Concern   Not on file  Social History Narrative   Current Social History 04/02/2021        Patient lives with his sister  in a home which is 1 story/stories. There are 2 steps up to the entrance the patient uses.       Patient's method of transportation is personal car.      The highest level of education was college diploma.      The patient currently retired.      Identified important Relationships are his girlfriend       Pets : 0       Interests / Fun: I shoot pool and go bowling every once in awhile.       Current Stressors: I don't have any stress       Religious / Personal Beliefs: [employer] which is similar to [employer]   Social Drivers of Health   Financial Resource Strain: Patient Unable To Answer (05/06/2024)  Overall Financial Resource Strain (CARDIA)    Difficulty of Paying Living Expenses: Patient unable to answer  Food Insecurity: No Food Insecurity (05/06/2024)   Hunger Vital Sign    Worried About Running Out of Food in the Last Year: Never true    Ran Out of Food in the Last Year: Never true  Transportation Needs: No Transportation Needs (05/06/2024)   PRAPARE - Administrator, Civil Service (Medical): No    Lack of Transportation (Non-Medical): No  Physical Activity: Insufficiently Active (05/06/2024)   Exercise Vital Sign    Days of Exercise per Week: 2 days    Minutes of Exercise per Session: 20 min  Stress: Patient Unable To Answer (05/06/2024)   [employer] of Occupational Health - Occupational Stress Questionnaire    Feeling of Stress: Patient unable to answer  Social Connections: Moderately Integrated (05/06/2024)   Social Connection and Isolation Panel    Frequency of Communication with Friends and  Family: More than three times a week    Frequency of Social Gatherings with Friends and Family: More than three times a week    Attends Religious Services: More than 4 times per year    Active Member of [employer] or Organizations: No    Attends Banker Meetings: 1 to 4 times per year    Marital Status: Never married  Intimate Partner Violence: Not At Risk (05/06/2024)   Humiliation, Afraid, Rape, and Kick questionnaire    Fear of Current or Ex-Partner: No    Emotionally Abused: No    Physically Abused: No    Sexually Abused: No    Review of Systems: ROS negative except for what is noted on the assessment and plan.  Vitals:   05/06/24 1045  BP: 138/82  Pulse: 83  Temp: (!) 97.5 F (36.4 C)  TempSrc: Oral  SpO2: 94%  Weight: 208 lb 3.2 oz (94.4 kg)  Height: 5' 8 (1.727 m)    Physical Exam: Constitutional: well-appearing, sitting in chair, in no acute distress Cardiovascular: regular rate and rhythm, no m/r/g Pulmonary/Chest: normal work of breathing on room air, lungs clear to auscultation bilaterally Abdominal: soft, non-tender, non-distended MSK: normal bulk and tone Skin: warm and dry Psych: normal mood and behavior  Assessment & Plan:     Patient discussed with Dr. Karna  Essential hypertension BP is 138/82 It has been chronic and stable.  Managed with olmesartan -amlodipine -HCTZ 40-10-12.5 mg daily.  hyperlipidemia Check lipid panel today. Managed with Lipitor 40mg  daily  Type 2 diabetes mellitus with hyperlipidemia (HCC) A1c is 6.1 which I congratulated him for. Increasing Ozempic  to 2 mg weekly today and he will continue Metformin  500 mg daily.   Tobacco use disorder Continues to smoke a few per day with > 30 pack-year history. Extensive discussion today about the importance of complete cessation.  He is confident he can do this on his own and will actively start today. CT lung cancer screening sent.   Norman Lobstein, D.O. [facility] Internal  Medicine, PGY-2 Phone: [phone] Date 05/08/2024 Time 8:18 PM

## 2024-05-07 ENCOUNTER — Ambulatory Visit: Payer: Self-pay | Admitting: Student

## 2024-05-07 LAB — LIPID PANEL
Chol/HDL Ratio: 3.1 ratio (ref 0.0–5.0)
Cholesterol, Total: 121 mg/dL (ref 100–199)
HDL: 39 mg/dL — ABNORMAL LOW (ref >39)
LDL Chol Calc (NIH): 69 mg/dL (ref 0–99)
Triglycerides: 59 mg/dL (ref 0–149)
VLDL Cholesterol Cal: 13 mg/dL (ref 5–40)

## 2024-05-08 NOTE — Assessment & Plan Note (Signed)
 BP is 138/82 It has been chronic and stable.  Managed with olmesartan -amlodipine -HCTZ 40-10-12.5 mg daily.

## 2024-05-08 NOTE — Assessment & Plan Note (Signed)
 A1c is 6.1 which I congratulated him for. Increasing Ozempic  to 2 mg weekly today and he will continue Metformin  500 mg daily.

## 2024-05-08 NOTE — Assessment & Plan Note (Signed)
 Continues to smoke a few per day with > 30 pack-year history. Extensive discussion today about the importance of complete cessation.  He is confident he can do this on his own and will actively start today. CT lung cancer screening sent.

## 2024-05-08 NOTE — Assessment & Plan Note (Addendum)
 Check lipid panel today. Managed with Lipitor 40mg  daily

## 2024-05-10 NOTE — Addendum Note (Signed)
 Addended by: KARNA FELLOWS on: 05/10/2024 11:47 AM   Modules accepted: Level of Service

## 2024-05-10 NOTE — Progress Notes (Signed)
 Internal Medicine Clinic Attending  Case discussed with the resident at the time of the visit.  We reviewed the resident's history and exam and pertinent patient test results.  I agree with the assessment, diagnosis, and plan of care documented in the resident's note.

## 2024-05-26 ENCOUNTER — Ambulatory Visit (INDEPENDENT_AMBULATORY_CARE_PROVIDER_SITE_OTHER): Admitting: Podiatry

## 2024-05-26 ENCOUNTER — Encounter: Payer: Self-pay | Admitting: Podiatry

## 2024-05-26 DIAGNOSIS — B351 Tinea unguium: Secondary | ICD-10-CM

## 2024-05-26 DIAGNOSIS — E785 Hyperlipidemia, unspecified: Secondary | ICD-10-CM

## 2024-05-26 DIAGNOSIS — M79675 Pain in left toe(s): Secondary | ICD-10-CM | POA: Diagnosis not present

## 2024-05-26 DIAGNOSIS — L84 Corns and callosities: Secondary | ICD-10-CM

## 2024-05-26 DIAGNOSIS — M79674 Pain in right toe(s): Secondary | ICD-10-CM

## 2024-05-26 DIAGNOSIS — E1169 Type 2 diabetes mellitus with other specified complication: Secondary | ICD-10-CM

## 2024-05-26 NOTE — Progress Notes (Signed)
  Subjective:  Patient ID: Samuel Weiss, male    DOB: 07/18/1951,   MRN: [id number]  Chief Complaint  Patient presents with   Diabetes    I need to get my calluses shaved and my toenails trimmed.  I need to get this toe (2nd right) here checked.  It rubs my shoe and it hurts.  Saw Dr. Norman Lobstein - 05/11/2024; A1c - 6.3    73 y.o. male presents for concern of thickened elongated and painful nails that are difficult to trim. Also complains of callus. Requesting to have them trimmed today. Relates burning and tingling in their feet. Patient is diabetic and last A1c was  Lab Results  Component Value Date   HGBA1C 6.1 05/06/2024   .   PCP:  Shawn Sick, MD    . Denies any other pedal complaints. Denies n/v/f/c.   Past Medical History:  Diagnosis Date   Alcohol use 04/23/2021   Chronic pain of left knee 04/23/2021   Pt reports pain onset earlier today as he was walking around the track. It resolves with rest, worsens with walking/weightbearing. He has not tried anything to help with the pain.         Dizziness 04/07/2019   Hyperlipidemia    Hypertension    Multiple facial fractures, open, initial encounter (HCC) 05/30/2018   Tobacco use disorder    Type 2 diabetes mellitus (HCC)     Objective:  Physical Exam: Vascular: DP/PT pulses 2/4 bilateral. CFT <3 seconds. Absent hair growth on digits. Edema noted to bilateral lower extremities. Xerosis noted bilaterally.  Skin. No lacerations or abrasions bilateral feet. Nails 1-5 bilateral  are thickened discolored and elongated with subungual debris. Hyperkeratotic cored lesions noted sub fifth metatarsal base bilateral feet. Hyperkeratoti lesion noted dorsum of right fourth digit with cored area.  Musculoskeletal: MMT 5/5 bilateral lower extremities in DF, PF, Inversion and Eversion. Deceased ROM in DF of ankle joint.  Neurological: Sensation intact to light touch. Protective sensation diminished bilateral.    Assessment:   1.  Pain due to onychomycosis of toenails of both feet   2. Type 2 diabetes mellitus with hyperlipidemia (HCC)   3. Callus of foot        Plan:  Patient was evaluated and treated and all questions answered. -Discussed and educated patient on diabetic foot care, especially with  regards to the vascular, neurological and musculoskeletal systems.  -Stressed the importance of good glycemic control and the detriment of not  controlling glucose levels in relation to the foot. -Discussed supportive shoes at all times and checking feet regularly.  -Mechanically debrided all nails 1-5 bilateral using sterile nail nipper and filed with dremel without incident  -Hyperkeratotic areas debrided without incident. Bilateral plantar areas were debrided x 2.  -Answered all patient questions -Patient to return  in 3 months for at risk foot care -Patient advised to call the office if any problems or questions arise in the meantime.   Asberry Failing, DPM

## 2024-06-23 ENCOUNTER — Other Ambulatory Visit: Payer: Self-pay

## 2024-06-23 NOTE — Telephone Encounter (Unsigned)
 Copied from CRM [id number]. Topic: Clinical - Medication Refill >> Jun 23, 2024 11:51 AM Chiquita SQUIBB wrote: Medication: Olmesartan -amLODIPine -HCTZ 40-10-12.5 MG TABS   Has the patient contacted their pharmacy? Yes (Agent: If no, request that the patient contact the pharmacy for the refill. If patient does not wish to contact the pharmacy document the reason why and proceed with request.) (Agent: If yes, when and what did the pharmacy advise?)  This is the patient's preferred pharmacy:    [facility] [phone] [address], [place] - [address] [place] [address] Phone: [phone] Fax: [phone]  Is this the correct pharmacy for this prescription? Yes If no, delete pharmacy and type the correct one.   Has the prescription been filled recently? No  Is the patient out of the medication? Yes  Has the patient been seen for an appointment in the last year OR does the patient have an upcoming appointment? Yes  Can we respond through MyChart? Yes  Agent: Please be advised that Rx refills may take up to 3 business days. We ask that you follow-up with your pharmacy.

## 2024-06-25 ENCOUNTER — Other Ambulatory Visit: Payer: Self-pay

## 2024-06-25 MED ORDER — OLMESARTAN-AMLODIPINE-HCTZ 40-10-12.5 MG PO TABS: 1.0000 | ORAL_TABLET | Freq: Every day | ORAL | 0 refills | Status: DC

## 2024-06-25 NOTE — Telephone Encounter (Signed)
 Copied from CRM [id number]. Topic: Clinical - Prescription Issue >> Jun 25, 2024  1:10 PM Carrielelia G wrote: Reason for CRM: Olmesartan -amLODIPine -HCTZ 40-10-12.5 MG TABS  [facility]. Can meds be sent to   [facility] [address], [place] - [address] [place] [address] Phone: [phone] Fax: [phone] Hours: Not open 24 hours   Has not taken meds in three days he stated.

## 2024-06-28 MED ORDER — OLMESARTAN-AMLODIPINE-HCTZ 40-10-12.5 MG PO TABS: 1.0000 | ORAL_TABLET | Freq: Every day | ORAL | 0 refills | Status: DC

## 2024-08-06 ENCOUNTER — Telehealth: Payer: Self-pay | Admitting: *Deleted

## 2024-08-06 NOTE — Telephone Encounter (Signed)
 Call from pt Handicap placard will be expiring at the end o fthe mth Form placed in pcp's box for completion Appt also given for 12/17  Will send message to pcp to make her aware No further action needed at this time Phone call complete

## 2024-08-17 ENCOUNTER — Telehealth: Payer: Self-pay

## 2024-08-17 NOTE — Telephone Encounter (Signed)
 I contacted pt to let him that his disability parking placard form has been completed and ready for pick up, but no answer. Unable to leave message, vm is full.

## 2024-08-25 ENCOUNTER — Ambulatory Visit: Admitting: Podiatry

## 2024-08-25 DIAGNOSIS — M79675 Pain in left toe(s): Secondary | ICD-10-CM

## 2024-08-25 DIAGNOSIS — B351 Tinea unguium: Secondary | ICD-10-CM

## 2024-08-25 DIAGNOSIS — E785 Hyperlipidemia, unspecified: Secondary | ICD-10-CM

## 2024-08-25 DIAGNOSIS — L84 Corns and callosities: Secondary | ICD-10-CM

## 2024-08-25 DIAGNOSIS — M79674 Pain in right toe(s): Secondary | ICD-10-CM

## 2024-08-25 DIAGNOSIS — E1169 Type 2 diabetes mellitus with other specified complication: Secondary | ICD-10-CM | POA: Diagnosis not present

## 2024-08-25 NOTE — Progress Notes (Signed)
°  Subjective:  Patient ID: Samuel Weiss, male    DOB: 08-Oct-1950,   MRN: [id number]  No chief complaint on file.   73 y.o. male presents for concern of thickened elongated and painful nails that are difficult to trim. Also complains of callus. Requesting to have them trimmed today. Relates burning and tingling in their feet. Patient is diabetic and last A1c was  Lab Results  Component Value Date   HGBA1C 6.1 05/06/2024   .   PCP:  Shawn Sick, MD    . Denies any other pedal complaints. Denies n/v/f/c.   Past Medical History:  Diagnosis Date   Alcohol use 04/23/2021   Chronic pain of left knee 04/23/2021   Pt reports pain onset earlier today as he was walking around the track. It resolves with rest, worsens with walking/weightbearing. He has not tried anything to help with the pain.         Dizziness 04/07/2019   Hyperlipidemia    Hypertension    Multiple facial fractures, open, initial encounter (HCC) 05/30/2018   Tobacco use disorder    Type 2 diabetes mellitus (HCC)     Objective:  Physical Exam: Vascular: DP/PT pulses 2/4 bilateral. CFT <3 seconds. Absent hair growth on digits. Edema noted to bilateral lower extremities. Xerosis noted bilaterally.  Skin. No lacerations or abrasions bilateral feet. Nails 1-5 bilateral  are thickened discolored and elongated with subungual debris. Hyperkeratotic cored lesions noted sub fifth metatarsal base bilateral feet. Hyperkeratoti lesion noted dorsum of right fourth digit with cored area.  Musculoskeletal: MMT 5/5 bilateral lower extremities in DF, PF, Inversion and Eversion. Deceased ROM in DF of ankle joint.  Neurological: Sensation intact to light touch. Protective sensation diminished bilateral.    Assessment:   1. Pain due to onychomycosis of toenails of both feet   2. Type 2 diabetes mellitus with hyperlipidemia (HCC)   3. Callus of foot        Plan:  Patient was evaluated and treated and all questions  answered. -Discussed and educated patient on diabetic foot care, especially with  regards to the vascular, neurological and musculoskeletal systems.  -Stressed the importance of good glycemic control and the detriment of not  controlling glucose levels in relation to the foot. -Discussed supportive shoes at all times and checking feet regularly.  -Mechanically debrided all nails 1-5 bilateral using sterile nail nipper and filed with dremel without incident  -Hyperkeratotic areas debrided without incident. Bilateral plantar areas were debrided x 2 and treated with salycylic acid. Discussed prevention techniques.  -Answered all patient questions -Patient to return  in 3 months for at risk foot care -Patient advised to call the office if any problems or questions arise in the meantime.   Asberry Failing, DPM

## 2024-09-01 ENCOUNTER — Other Ambulatory Visit: Payer: Self-pay

## 2024-09-01 ENCOUNTER — Ambulatory Visit

## 2024-09-01 VITALS — BP 123/67 | HR 91 | Temp 98.1°F | Ht 68.0 in | Wt 208.4 lb

## 2024-09-01 DIAGNOSIS — E1169 Type 2 diabetes mellitus with other specified complication: Secondary | ICD-10-CM | POA: Diagnosis not present

## 2024-09-01 DIAGNOSIS — Z7985 Long-term (current) use of injectable non-insulin antidiabetic drugs: Secondary | ICD-10-CM

## 2024-09-01 DIAGNOSIS — Z0000 Encounter for general adult medical examination without abnormal findings: Secondary | ICD-10-CM

## 2024-09-01 DIAGNOSIS — E785 Hyperlipidemia, unspecified: Secondary | ICD-10-CM

## 2024-09-01 DIAGNOSIS — F17210 Nicotine dependence, cigarettes, uncomplicated: Secondary | ICD-10-CM

## 2024-09-01 DIAGNOSIS — Z13220 Encounter for screening for lipoid disorders: Secondary | ICD-10-CM

## 2024-09-01 DIAGNOSIS — F17200 Nicotine dependence, unspecified, uncomplicated: Secondary | ICD-10-CM

## 2024-09-01 DIAGNOSIS — I10 Essential (primary) hypertension: Secondary | ICD-10-CM

## 2024-09-01 LAB — POCT GLYCOSYLATED HEMOGLOBIN (HGB A1C): HbA1c, POC (controlled diabetic range): 6.2 % (ref 0.0–7.0)

## 2024-09-01 LAB — GLUCOSE, CAPILLARY: Glucose-Capillary: 96 mg/dL (ref 70–99)

## 2024-09-01 MED ORDER — COVID-19 MRNA VAC-TRIS(PFIZER) 30 MCG/0.3ML IM SUSY: 0.3000 mL | PREFILLED_SYRINGE | Freq: Once | INTRAMUSCULAR | 0 refills | Status: AC

## 2024-09-01 NOTE — Patient Instructions (Signed)
 Thank you for coming in today. If you have any questions or concerns, please feel free to contact me via MyChart or call the office.   If you have gotten labs today, I will be in touch via MyChart or telephone.   Have a great day.

## 2024-09-01 NOTE — Progress Notes (Signed)
 Subjective:   Patient ID: Samuel Weiss male   DOB: [birth year] 73 y.o.   MRN: [id number]  HPI: [name] is a 30M PMH HTN, HLD, T2DM, tobacco use disorder presenting for annual physical.  No complaints today. Feels well.   HTN - well controlled right now on current regimen, no issues w/ adherence. BP today 123/67.  HLD - LDL 69 04/2024. On Lipitor 40.  T2DM - on Ozempic  2 mg weekly and MFM, tolerating well. A1c 6.2 today.  Tobacco use disorder - same, ~3 cigarettes every day, >1 pack a week. Not interested in NRT/MAT at the moment. Reinforced low dose CT for lung cancer screening.  Past Medical History:  Diagnosis Date   Alcohol use 04/23/2021   Chronic pain of left knee 04/23/2021   Pt reports pain onset earlier today as he was walking around the track. It resolves with rest, worsens with walking/weightbearing. He has not tried anything to help with the pain.         Dizziness 04/07/2019   Hyperlipidemia    Hypertension    Multiple facial fractures, open, initial encounter (HCC) 05/30/2018   Tobacco use disorder    Type 2 diabetes mellitus (HCC)    Current Outpatient Medications  Medication Sig Dispense Refill   atorvastatin  (LIPITOR) 40 MG tablet Take 1 tablet (40 mg total) by mouth daily. 90 tablet 4   metFORMIN  (GLUCOPHAGE ) 500 MG tablet Take 1 tablet (500 mg total) by mouth daily with breakfast. 90 tablet 3   Olmesartan -amLODIPine -HCTZ 40-10-12.5 MG TABS Take 1 tablet by mouth daily. 90 tablet 0   Semaglutide , 2 MG/DOSE, (OZEMPIC , 2 MG/DOSE,) 8 MG/3ML SOPN Inject 2 mg into the skin once a week. 3 mL 6   No current facility-administered medications for this visit.   Family History  Problem Relation Age of Onset   Hypertension Mother    Diabetes Mother    Hypertension Father    Kidney disease Sister    Diabetes Sister    Obesity Sister    Diabetes Sister    Heart disease Sister    Kidney disease Sister    Diabetes Sister    Kidney disease Brother     Kidney disease Brother    Cancer Maternal Aunt    Social History   Socioeconomic History   Marital status: Single    Spouse name: Not on file   Number of children: 4   Years of education: Not on file   Highest education level: Bachelor's degree (e.g., BA, AB, BS)  Occupational History   Not on file  Tobacco Use   Smoking status: Every Day    Current packs/day: 0.15    Types: Cigarettes   Smokeless tobacco: Current   Tobacco comments:    I smoke 3 cigarettes a day.  Vaping Use   Vaping status: Never Used  Substance and Sexual Activity   Alcohol use: Not Currently    Alcohol/week: 3.0 standard drinks of alcohol    Types: 3 Cans of beer per week   Drug use: Not Currently    Types: Marijuana   Sexual activity: Not Currently  Other Topics Concern   Not on file  Social History Narrative   Current Social History 04/02/2021        Patient lives with his sister  in a home which is 1 story/stories. There are 2 steps up to the entrance the patient uses.       Patient's method of transportation is personal car.  The highest level of education was college diploma.      The patient currently retired.      Identified important Relationships are his girlfriend       Pets : 0       Interests / Fun: I shoot pool and go bowling every once in awhile.       Current Stressors: I don't have any stress       Religious / Personal Beliefs: Wholist which is similar to baptist.   Social Drivers of Health   Tobacco Use: High Risk (09/01/2024)   Patient History    Smoking Tobacco Use: Every Day    Smokeless Tobacco Use: Current    Passive Exposure: Not on file  Financial Resource Strain: Patient Unable To Answer (05/06/2024)   Overall Financial Resource Strain (CARDIA)    Difficulty of Paying Living Expenses: Patient unable to answer  Food Insecurity: No Food Insecurity (05/06/2024)   Epic    Worried About Radiation Protection Practitioner of Food in the Last Year: Never true    Ran Out of Food  in the Last Year: Never true  Transportation Needs: No Transportation Needs (05/06/2024)   Epic    Lack of Transportation (Medical): No    Lack of Transportation (Non-Medical): No  Physical Activity: Insufficiently Active (05/06/2024)   Exercise Vital Sign    Days of Exercise per Week: 2 days    Minutes of Exercise per Session: 20 min  Stress: Patient Unable To Answer (05/06/2024)   [employer] of Occupational Health - Occupational Stress Questionnaire    Feeling of Stress: Patient unable to answer  Social Connections: Moderately Integrated (05/06/2024)   Social Connection and Isolation Panel    Frequency of Communication with Friends and Family: More than three times a week    Frequency of Social Gatherings with Friends and Family: More than three times a week    Attends Religious Services: More than 4 times per year    Active Member of Clubs or Organizations: No    Attends Banker Meetings: 1 to 4 times per year    Marital Status: Never married  Depression (PHQ2-9): Low Risk (09/01/2024)   Depression (PHQ2-9)    PHQ-2 Score: 0  Alcohol Screen: Low Risk (05/06/2024)   Alcohol Screen    Last Alcohol Screening Score (AUDIT): 0  Housing: Low Risk (05/06/2024)   Epic    Unable to Pay for Housing in the Last Year: No    Number of Times Moved in the Last Year: 0    Homeless in the Last Year: No  Utilities: Not At Risk (05/06/2024)   Epic    Threatened with loss of utilities: No  Health Literacy: Adequate Health Literacy (05/06/2024)   B1300 Health Literacy    Frequency of need for help with medical instructions: Never   Review of Systems: Pertinent items are noted in HPI. Objective:   Vitals:   09/01/24 0932  BP: 123/67  Pulse: 91  Temp: 98.1 F (36.7 C)  TempSrc: Oral  SpO2: 100%  Weight: 208 lb 6.4 oz (94.5 kg)  Height: 5' 8 (1.727 m)    Physical Exam: GEN: Well appearing, NAD. Oriented x 3, normal mood and affect  HEENT: Normocephalic, atraumatic,  Conjunctiva clear, sclera non-icteric CV: RRR, no M/R/G PULM: CTAB GI: no TTP in all 4 quadrants NEURO: moving all extremities spontaneously in upper and lower extremities. PSYCH: Oriented X3, intact recent and remote memory, judgment and insight, normal mood and affect.  Assessment & Plan:   Assessment & Plan Healthcare maintenance Shingrix, Covid vaccine ordered to pharmacy. Counseled on importance of these. LDCT scan ordered last visit, reiterated pt regarding the indication and need for LDCT. Type 2 diabetes mellitus with hyperlipidemia (HCC) A1c 6.2, at goal today. UTD on ophtho, foot exam, UACR. Continue Ozempic , MFM. Recheck BMP today. Tobacco use disorder Tobacco cessation counseling provided. Essential hypertension BP well controlled on current regimen (olmesartan -amlodipine -hydrochlorothiazide  40-10-12.5). Refilled BP meds today. hyperlipidemia Cont atorvastatin  40 mg.  RTC 6 mos.  Jone Dauphin MD

## 2024-09-01 NOTE — Assessment & Plan Note (Signed)
Cont atorvastatin 40 mg

## 2024-09-01 NOTE — Assessment & Plan Note (Signed)
Tobacco cessation counseling provided

## 2024-09-01 NOTE — Assessment & Plan Note (Signed)
 BP well controlled on current regimen (olmesartan -amlodipine -hydrochlorothiazide  40-10-12.5). Refilled BP meds today.

## 2024-09-01 NOTE — Assessment & Plan Note (Signed)
 Shingrix, Covid vaccine ordered to pharmacy. Counseled on importance of these. LDCT scan ordered last visit, reiterated pt regarding the indication and need for LDCT.

## 2024-09-01 NOTE — Assessment & Plan Note (Signed)
 A1c 6.2, at goal today. UTD on ophtho, foot exam, UACR. Continue Ozempic , MFM. Recheck BMP today.

## 2024-09-02 LAB — BASIC METABOLIC PANEL WITH GFR
BUN/Creatinine Ratio: 14 (ref 10–24)
BUN: 15 mg/dL (ref 8–27)
CO2: 20 mmol/L (ref 20–29)
Calcium: 9.9 mg/dL (ref 8.6–10.2)
Chloride: 102 mmol/L (ref 96–106)
Creatinine, Ser: 1.11 mg/dL (ref 0.76–1.27)
Glucose: 85 mg/dL (ref 70–99)
Potassium: 4.2 mmol/L (ref 3.5–5.2)
Sodium: 141 mmol/L (ref 134–144)
eGFR: 69 mL/min/1.73 (ref >59)

## 2024-09-03 ENCOUNTER — Ambulatory Visit: Payer: Self-pay

## 2024-09-29 ENCOUNTER — Other Ambulatory Visit: Payer: Self-pay | Admitting: *Deleted

## 2024-09-29 NOTE — Telephone Encounter (Signed)
 Received refill request from pts pharmacy for olmesartan -amlodipine -hydrochlorothiazide  from [facility]  Pharmacy is not on pt's profile CMA contacted pt to confirm pharmacy Per pt, this is an old request and he has his medications already. Pt will reach out to pharmacy when he needs a refill Per pt, office can disregard request

## 2024-10-05 ENCOUNTER — Telehealth: Payer: Self-pay

## 2024-10-05 NOTE — Telephone Encounter (Signed)
 Pt stated he talked to someone else at [facility] and was told everything was ok. Pt stated he will call back if needed.

## 2024-10-05 NOTE — Telephone Encounter (Signed)
 Message has been forwarded to triage nurse to follow up with patient.   Copied from CRM #[id number]. Topic: General - Other >> Oct 05, 2024  9:41 AM DeAngela L wrote: Reason for CRM: Patient calling cause he has a Ucard with [employer] associated to his chronic illness and he was told his doctor has to complete information online for the patient to be reinstated and able to use his card to get groceries.  Patient num  [phone]

## 2024-10-13 ENCOUNTER — Other Ambulatory Visit: Payer: Self-pay

## 2024-10-13 MED ORDER — OLMESARTAN-AMLODIPINE-HCTZ 40-10-12.5 MG PO TABS: 1.0000 | ORAL_TABLET | Freq: Every day | ORAL | 0 refills | Status: AC

## 2024-10-13 NOTE — Telephone Encounter (Signed)
 Medication sent to pharmacy

## 2024-10-19 ENCOUNTER — Telehealth: Payer: Self-pay | Admitting: *Deleted

## 2024-10-19 DIAGNOSIS — E1169 Type 2 diabetes mellitus with other specified complication: Secondary | ICD-10-CM

## 2024-10-19 MED ORDER — OZEMPIC (2 MG/DOSE) 8 MG/3ML ~~LOC~~ SOPN: 2.0000 mg | PEN_INJECTOR | SUBCUTANEOUS | 3 refills | Status: AC

## 2024-11-23 ENCOUNTER — Ambulatory Visit: Admitting: Podiatry
